# Patient Record
Sex: Male | Born: 1965 | Race: White | Hispanic: No | Smoking: Current every day smoker
Health system: Southern US, Community
[De-identification: ages and names within clinical notes are randomized; demographics above are authoritative.]

## PROBLEM LIST (undated history)

## (undated) DIAGNOSIS — I872 Venous insufficiency (chronic) (peripheral): Secondary | ICD-10-CM

## (undated) DIAGNOSIS — S060X9A Concussion with loss of consciousness of unspecified duration, initial encounter: Secondary | ICD-10-CM

## (undated) DIAGNOSIS — S3992XA Unspecified injury of lower back, initial encounter: Secondary | ICD-10-CM

## (undated) DIAGNOSIS — R0989 Other specified symptoms and signs involving the circulatory and respiratory systems: Secondary | ICD-10-CM

## (undated) DIAGNOSIS — E785 Hyperlipidemia, unspecified: Secondary | ICD-10-CM

## (undated) DIAGNOSIS — I2510 Atherosclerotic heart disease of native coronary artery without angina pectoris: Secondary | ICD-10-CM

## (undated) DIAGNOSIS — F1020 Alcohol dependence, uncomplicated: Secondary | ICD-10-CM

## (undated) DIAGNOSIS — S62101A Fracture of unspecified carpal bone, right wrist, initial encounter for closed fracture: Secondary | ICD-10-CM

## (undated) DIAGNOSIS — L400 Psoriasis vulgaris: Secondary | ICD-10-CM

## (undated) DIAGNOSIS — S42009A Fracture of unspecified part of unspecified clavicle, initial encounter for closed fracture: Secondary | ICD-10-CM

## (undated) DIAGNOSIS — J449 Chronic obstructive pulmonary disease, unspecified: Secondary | ICD-10-CM

## (undated) DIAGNOSIS — S42301A Unspecified fracture of shaft of humerus, right arm, initial encounter for closed fracture: Secondary | ICD-10-CM

## (undated) DIAGNOSIS — I219 Acute myocardial infarction, unspecified: Secondary | ICD-10-CM

## (undated) DIAGNOSIS — S060XAA Concussion with loss of consciousness status unknown, initial encounter: Secondary | ICD-10-CM

## (undated) DIAGNOSIS — S82899A Other fracture of unspecified lower leg, initial encounter for closed fracture: Secondary | ICD-10-CM

## (undated) DIAGNOSIS — E669 Obesity, unspecified: Secondary | ICD-10-CM

## (undated) DIAGNOSIS — R001 Bradycardia, unspecified: Secondary | ICD-10-CM

## (undated) DIAGNOSIS — F17200 Nicotine dependence, unspecified, uncomplicated: Secondary | ICD-10-CM

## (undated) HISTORY — DX: Nicotine dependence, unspecified, uncomplicated: F17.200

## (undated) HISTORY — DX: Bradycardia, unspecified: R00.1

## (undated) HISTORY — DX: Unspecified fracture of shaft of humerus, right arm, initial encounter for closed fracture: S42.301A

## (undated) HISTORY — DX: Concussion with loss of consciousness of unspecified duration, initial encounter: S06.0X9A

## (undated) HISTORY — DX: Other fracture of unspecified lower leg, initial encounter for closed fracture: S82.899A

## (undated) HISTORY — PX: WRIST FRACTURE SURGERY: SHX121

## (undated) HISTORY — DX: Fracture of unspecified carpal bone, right wrist, initial encounter for closed fracture: S62.101A

## (undated) HISTORY — PX: WISDOM TOOTH EXTRACTION: SHX21

## (undated) HISTORY — DX: Acute myocardial infarction, unspecified: I21.9

## (undated) HISTORY — DX: Chronic obstructive pulmonary disease, unspecified: J44.9

## (undated) HISTORY — DX: Obesity, unspecified: E66.9

## (undated) HISTORY — DX: Hyperlipidemia, unspecified: E78.5

## (undated) HISTORY — DX: Atherosclerotic heart disease of native coronary artery without angina pectoris: I25.10

## (undated) HISTORY — DX: Unspecified injury of lower back, initial encounter: S39.92XA

## (undated) HISTORY — DX: Alcohol dependence, uncomplicated: F10.20

## (undated) HISTORY — DX: Fracture of unspecified part of unspecified clavicle, initial encounter for closed fracture: S42.009A

## (undated) HISTORY — DX: Other specified symptoms and signs involving the circulatory and respiratory systems: R09.89

## (undated) HISTORY — DX: Venous insufficiency (chronic) (peripheral): I87.2

## (undated) HISTORY — DX: Psoriasis vulgaris: L40.0

## (undated) HISTORY — PX: CORONARY STENT PLACEMENT: SHX1402

## (undated) HISTORY — DX: Concussion with loss of consciousness status unknown, initial encounter: S06.0XAA

---

## 2014-04-17 DIAGNOSIS — F17200 Nicotine dependence, unspecified, uncomplicated: Secondary | ICD-10-CM | POA: Insufficient documentation

## 2014-04-17 DIAGNOSIS — I2510 Atherosclerotic heart disease of native coronary artery without angina pectoris: Secondary | ICD-10-CM | POA: Insufficient documentation

## 2014-04-19 DIAGNOSIS — L400 Psoriasis vulgaris: Secondary | ICD-10-CM | POA: Insufficient documentation

## 2014-04-19 DIAGNOSIS — E785 Hyperlipidemia, unspecified: Secondary | ICD-10-CM | POA: Insufficient documentation

## 2014-04-19 DIAGNOSIS — R0989 Other specified symptoms and signs involving the circulatory and respiratory systems: Secondary | ICD-10-CM | POA: Insufficient documentation

## 2019-09-19 ENCOUNTER — Other Ambulatory Visit: Payer: Self-pay

## 2019-09-19 ENCOUNTER — Ambulatory Visit (INDEPENDENT_AMBULATORY_CARE_PROVIDER_SITE_OTHER): Payer: Self-pay | Admitting: Nurse Practitioner

## 2019-09-19 ENCOUNTER — Telehealth: Payer: Self-pay

## 2019-09-19 VITALS — BP 122/71 | HR 70 | Temp 98.1°F | Ht 71.0 in | Wt 242.0 lb

## 2019-09-19 DIAGNOSIS — F17200 Nicotine dependence, unspecified, uncomplicated: Secondary | ICD-10-CM

## 2019-09-19 DIAGNOSIS — E782 Mixed hyperlipidemia: Secondary | ICD-10-CM

## 2019-09-19 DIAGNOSIS — Z7689 Persons encountering health services in other specified circumstances: Secondary | ICD-10-CM

## 2019-09-19 DIAGNOSIS — L400 Psoriasis vulgaris: Secondary | ICD-10-CM

## 2019-09-19 DIAGNOSIS — L409 Psoriasis, unspecified: Secondary | ICD-10-CM

## 2019-09-19 DIAGNOSIS — R0989 Other specified symptoms and signs involving the circulatory and respiratory systems: Secondary | ICD-10-CM

## 2019-09-19 DIAGNOSIS — I2510 Atherosclerotic heart disease of native coronary artery without angina pectoris: Secondary | ICD-10-CM

## 2019-09-19 MED ORDER — CETIRIZINE HCL 10 MG PO TABS: 10.0000 mg | ORAL_TABLET | Freq: Every day | ORAL | 11 refills | Status: AC

## 2019-09-19 MED ORDER — CLOBETASOL PROPIONATE 0.05 % EX OINT: 1.0000 "application " | TOPICAL_OINTMENT | Freq: Two times a day (BID) | CUTANEOUS | 2 refills | Status: DC

## 2019-09-19 MED ORDER — ALBUTEROL SULFATE HFA 108 (90 BASE) MCG/ACT IN AERS: 2.0000 | INHALATION_SPRAY | Freq: Four times a day (QID) | RESPIRATORY_TRACT | 2 refills | Status: DC | PRN

## 2019-09-19 NOTE — Assessment & Plan Note (Addendum)
Chronic, ongoing.  Never diagnosed with COPD but likely cause with extensive smoking history.  Rx given for new albuterol inahler.  Will need to perform PFTs in near future.  Follow up 1 month.

## 2019-09-19 NOTE — Assessment & Plan Note (Addendum)
Chronic, ongoing.  Previously on statin medication but not currently taking due to cost.  Important to monitor cholesterol levels closely especially with CAD history, will discuss at follow up in 1 month.

## 2019-09-19 NOTE — Assessment & Plan Note (Signed)
Chronic, ongoing.  Patient has cut back over the years from 3 ppd to 1.5 ppd.  Currently trying to continue cutting back with end goal of quitting smoking completely.  Will continue to support patient with pharmacologic therapy and/or counseling during cessation process.  Follow up 1 month.

## 2019-09-19 NOTE — Progress Notes (Signed)
BP 122/71 (BP Location: Right Arm, Patient Position: Sitting, Cuff Size: Normal)   Pulse 70   Temp 98.1 F (36.7 C) (Oral)   Ht 5\' 11"  (1.803 m)   Wt 242 lb (109.8 kg)   SpO2 100%   BMI 33.75 kg/m    Subjective:    Patient ID: Jacob Owen, male    DOB: 07-16-1965, 54 y.o.   MRN: [id number]  HPI: Jacob Owen is a 54 y.o. male presents for new patient visit to establish care.  Introduced to 59 role and practice setting.  All questions answered.  Discussed provider/patient relationship and expectations.  Chief Complaint  Patient presents with  . Establish Care  . Psoriasis    Severe psoriasis all over body  . Bed Bugs  . COPD   Patient has multiple chronic issues and has been lost to follow up for years due to being out of insurance and not being able to afford his medications.  He is here today for his psoriasis, which has become severe, and COPD primarily.  He states he will only be located in this area [place] for the next few months, while he is settling his mother's estate.  He plans to move away from this area shortly after and plans to obtain insurance and establish with a new primary care provider at that time.  SKIN LESION Patient was diagnosed with psoriasis when he was 20 and at [facility] in [place].  He reports it started on his nose and has spread to all over his body throughout his life.  Patient also reports bed bugs at the place that he is staying as well as black mold.  He is starting to have peeling on his hands and this is new.  Patient is requesting a referral for a dermatologist.   Duration: years Location: head to toe Painful: no Itching: yes; feels like a really bad sunburn  Onset: sudden Context: bigger Associated signs and symptoms: no fever, chest pain, or shortness of breath. History of skin cancer: no History of precancerous skin lesions: no Family history of skin cancer: no  COPD He was diagnosed with COPD a few  years ago.  He is taking albuterol inhaler a couple of times per day or more.  When patient's mother passed away in 2022/11/25, he took her inhaler and has been using that since he has not had one. COPD status: stable Satisfied with current treatment?: no Oxygen use: no Dyspnea frequency: daily Cough frequency: daily Rescue inhaler frequency: a couple of timers per day   Limitation of activity: yes Productive cough:  yes Last Spirometry: per patient, never performed Pneumovax: unknown Influenza: deferred to flu season  Allergies  Allergen Reactions  . Chocolate     Other reaction(s): ANAPHYLAXIS  . Codeine Nausea And Vomiting  . Penicillins Itching   Outpatient Encounter Medications as of 09/19/2019  Medication Sig  . albuterol (VENTOLIN HFA) 108 (90 Base) MCG/ACT inhaler Inhale 2 puffs into the lungs every 6 (six) hours as needed.  . Coal Tar Extract (PSORIASIN EX) Apply 1 application topically daily as needed.  . [DISCONTINUED] albuterol (VENTOLIN HFA) 108 (90 Base) MCG/ACT inhaler Inhale 2 puffs into the lungs every 6 (six) hours as needed.  . cetirizine (ZYRTEC) 10 MG tablet Take 1 tablet (10 mg total) by mouth daily.  . clobetasol ointment (TEMOVATE) 0.05 % Apply 1 application topically 2 (two) times daily.   No facility-administered encounter medications on file as of 09/19/2019.  Active Ambulatory Problems    Diagnosis Date Noted  . Coronary artery disease involving native coronary artery of native heart without angina pectoris 04/17/2014  . Hyperlipidemia 04/19/2014  . Plaque psoriasis 04/19/2014  . Suspected respiratory disease 04/19/2014  . Tobacco use disorder 04/17/2014   Resolved Ambulatory Problems    Diagnosis Date Noted  . No Resolved Ambulatory Problems   Past Medical History:  Diagnosis Date  . Alcoholism ([place])   . Ankle fracture   . Arm fracture, right   . Bradycardia   . Chronic venous stasis dermatitis of both lower extremities   . Collar bone fracture    . Concussion   . COPD (chronic obstructive pulmonary disease) ([place])   . Myocardial infarction ([place])   . Obesity   . Right wrist fracture   . Tailbone injury    Past Surgical History:  Procedure Laterality Date  . CORONARY STENT PLACEMENT    . WISDOM TOOTH EXTRACTION    . WRIST FRACTURE SURGERY     2 screws put in wrist   Family History  Problem Relation Age of Onset  . Diabetes Mother   . Emphysema Mother   . Alzheimer's disease Mother   . Alcoholism Mother   . Diabetes Brother   . Hepatitis C Father   . Biliary Cirrhosis Father   . Mental illness Maternal Grandmother    Social History   Tobacco Use  . Smoking status: Current Every Day Smoker    Packs/day: 1.00    Years: 44.00    Pack years: 44.00    Types: Cigarettes  . Smokeless tobacco: Never Used  Substance Use Topics  . Alcohol use: Not Currently    Comment: Hasn't drank in 36 years.  . Drug use: Yes    Types: Marijuana    Comment: Very little-Rarely   Review of Systems  Constitutional: Negative.  Negative for chills, fatigue and fever.  HENT: Negative.   Respiratory: Positive for shortness of breath. Negative for chest tightness and wheezing.   Cardiovascular: Negative.  Negative for chest pain and palpitations.  Gastrointestinal: Negative.   Musculoskeletal: Negative.   Skin: Positive for color change and rash. Negative for pallor and wound.  Neurological: Negative.  Negative for dizziness, light-headedness and headaches.  Psychiatric/Behavioral: Negative.  Negative for confusion. The patient is not nervous/anxious.    Per HPI unless specifically indicated above     Objective:    BP 122/71 (BP Location: Right Arm, Patient Position: Sitting, Cuff Size: Normal)   Pulse 70   Temp 98.1 F (36.7 C) (Oral)   Ht 5\' 11"  (1.803 m)   Wt 242 lb (109.8 kg)   SpO2 100%   BMI 33.75 kg/m   Wt Readings from Last 3 Encounters:  09/19/19 242 lb (109.8 kg)    Physical Exam Vitals and nursing note reviewed.   Constitutional:      General: He is not in acute distress.    Appearance: Normal appearance. He is not toxic-appearing.  HENT:     Head: Normocephalic and atraumatic.  Eyes:     General: No scleral icterus.    Extraocular Movements: Extraocular movements intact.  Cardiovascular:     Rate and Rhythm: Normal rate and regular rhythm.  Pulmonary:     Effort: Pulmonary effort is normal. No respiratory distress.     Breath sounds: Decreased air movement present. No wheezing, rhonchi or rales.  Abdominal:     General: Abdomen is flat. There is no distension.  Tenderness: There is no abdominal tenderness.  Musculoskeletal:        General: No swelling. Normal range of motion.     Right lower leg: No edema.     Left lower leg: No edema.  Skin:    Findings: Lesion (multiple circular plaques over body from head to toe resembling psoriasis) present. No abscess or wound.  Neurological:     General: No focal deficit present.     Mental Status: He is alert and oriented to person, place, and time.     Motor: No weakness.     Gait: Gait normal.  Psychiatric:        Mood and Affect: Mood normal.        Behavior: Behavior normal.        Thought Content: Thought content normal.        Judgment: Judgment normal.    No results found for this or any previous visit.    Assessment & Plan:   Problem List Items Addressed This Visit      Cardiovascular and Mediastinum   Coronary artery disease involving native coronary artery of native heart without angina pectoris    Chronic, ongoing.  Will need labs drawn and to establish with Cardiologist in future.  No chest pain, shortness of breath, or symptoms currently.  BP and HR stable in office today.  Follow up in 1 month.        Musculoskeletal and Integument   Plaque psoriasis    Chronic, ongoing.  Will start zyrtec daily to help with itching and clobetasol ointment bid and place referral to Dermatology.  CCM consulted for medication assistance  program.  Follow up in 1 month.        Other   Hyperlipidemia    Chronic, ongoing.  Previously on statin medication but not currently taking due to cost.  Important to monitor cholesterol levels closely especially with CAD history, will discuss at follow up in 1 month.      Suspected respiratory disease    Chronic, ongoing.  Never diagnosed with COPD but likely cause with extensive smoking history.  Rx given for new albuterol inahler.  Will need to perform PFTs in near future.  Follow up 1 month.      Relevant Medications   albuterol (VENTOLIN HFA) 108 (90 Base) MCG/ACT inhaler   Other Relevant Orders   Referral to Chronic Care Management Services   Tobacco use disorder    Chronic, ongoing.  Patient has cut back over the years from 3 ppd to 1.5 ppd.  Currently trying to continue cutting back with end goal of quitting smoking completely.  Will continue to support patient with pharmacologic therapy and/or counseling during cessation process.  Follow up 1 month.       Other Visit Diagnoses    Psoriasis    -  Primary   Relevant Medications   cetirizine (ZYRTEC) 10 MG tablet   clobetasol ointment (TEMOVATE) 0.05 %   Other Relevant Orders   Ambulatory referral to Dermatology   Referral to Chronic Care Management Services   Encounter to establish care           Follow up plan: Return in about 1 month (around 10/20/2019) for follow up.

## 2019-09-19 NOTE — Assessment & Plan Note (Addendum)
Chronic, ongoing.  Will need labs drawn and to establish with Cardiologist in future.  No chest pain, shortness of breath, or symptoms currently.  BP and HR stable in office today.  Follow up in 1 month.

## 2019-09-19 NOTE — Telephone Encounter (Signed)
Copied from CRM [id number]. Topic: General - Inquiry >> Sep 19, 2019  3:08 PM Crist Infante wrote: Reason for CRM: pt states he was looking at his AVS, and his weight was recorded incorrectly..  Pt states he actually weighed in at 242 lb.  His AVS says 171 lbs. Pt does not want the dr to think he has gained a bunch of weight when he comes back next time.   Chart updated with correct information.

## 2019-09-19 NOTE — Assessment & Plan Note (Addendum)
Chronic, ongoing.  Will start zyrtec daily to help with itching and clobetasol ointment bid and place referral to Dermatology.  CCM consulted for medication assistance program.  Follow up in 1 month.

## 2019-09-22 ENCOUNTER — Other Ambulatory Visit: Payer: Self-pay | Admitting: Nurse Practitioner

## 2019-09-22 ENCOUNTER — Ambulatory Visit: Payer: Self-pay

## 2019-09-22 ENCOUNTER — Telehealth: Payer: Self-pay | Admitting: Nurse Practitioner

## 2019-09-22 ENCOUNTER — Encounter: Payer: Self-pay | Admitting: Nurse Practitioner

## 2019-09-22 DIAGNOSIS — L409 Psoriasis, unspecified: Secondary | ICD-10-CM

## 2019-09-22 MED ORDER — PREDNISONE 10 MG PO TABS: ORAL_TABLET | ORAL | 0 refills | Status: DC

## 2019-09-22 NOTE — Telephone Encounter (Signed)
I also received the patient's mychart message this morning and responded to same concern there - have sent in a steroid taper and offered virtual video visit if he would like me to take a look at his hands.

## 2019-09-22 NOTE — Chronic Care Management (AMB) (Signed)
  Care Management   Note  09/22/2019 Name: Jacob Owen MRN: [id number] DOB: 04-03-1966  Jacob Owen is a 54 y.o. year old male who is a primary care patient of Mardene Celeste I, NP. I reached out to Minette Brine by phone today in response to a referral sent by [name] health plan.    [name] was given information about care management services today including:  1. Care management services include personalized support from designated clinical staff supervised by his physician, including individualized plan of care and coordination with other care providers 2. 24/7 contact phone numbers for assistance for urgent and routine care needs. 3. The patient may stop care management services at any time by phone call to the office staff.  Patient agreed to services and verbal consent obtained.   Follow up plan: Telephone appointment with care management team member scheduled for:10/08/2019  Elisha Ponder, LPN Health Advisor, Embedded Care Coordination [facility] Health Care Management ??[email or web address] ??[phone]

## 2019-09-22 NOTE — Telephone Encounter (Signed)
Pt. Called. Thought he had a missed call from triage. He did not. Verbalizes understanding.

## 2019-09-22 NOTE — Telephone Encounter (Signed)
Pt. Reports he was seen Friday and has psoriasis and bed bug bites. Both hands are swollen. Mild redness. Taking Zyrtec. Concerned he could be allergic to the bed bugs. Wants to know if there is something else he can take.Please advise pt.  Answer Assessment - Initial Assessment Questions 1. ONSET: "When did the swelling start?" (e.g., minutes, hours, days)     Started Friday 2. LOCATION: "What part of the hand is swollen?"  "Are both hands swollen or just one hand?"     Both hands 3. SEVERITY: "How bad is the swelling?" (e.g., localized; mild, moderate, severe)   - BALL OR LUMP: small ball or lump   - LOCALIZED: puffy or swollen area or patch of skin   - JOINT SWELLING: swelling of a joint   - MILD: puffiness or mild swelling of fingers or hand   - MODERATE: fingers and hand are swollen   - SEVERE: swelling of entire hand and up into forearm     Moderate 4. REDNESS: "Does the swelling look red or infected?"     Mild 5. PAIN: "Is the swelling painful to touch?" If so, ask: "How painful is it?"   (Scale 1-10; mild, moderate or severe)     Shoulders, upper arms 6. FEVER: "Do you have a fever?" If so, ask: "What is it, how was it measured, and when did it start?"      Chills at night 7. CAUSE: "What do you think is causing the hand swelling?" (e.g., heat, insect bite, pregnancy, recent injury)     Bed bugs and psoriasis  8. MEDICAL HISTORY: "Do you have a history of heart failure, kidney disease, liver failure, or cancer?"     No 9. RECURRENT SYMPTOM: "Have you had hand swelling before?" If so, ask: "When was the last time?" "What happened that time?"     Yes 10. OTHER SYMPTOMS: "Do you have any other symptoms?" (e.g., blurred vision, difficulty breathing, headache)       No 11. PREGNANCY: "Is there any chance you are pregnant?" "When was your last menstrual period?"       n/a  Protocols used: HAND [facility]

## 2019-09-24 ENCOUNTER — Encounter: Payer: Self-pay | Admitting: Nurse Practitioner

## 2019-09-25 NOTE — Telephone Encounter (Signed)
Would he benefit from a referral to CCM team, to see if they can help him with getting his medications?

## 2019-09-26 ENCOUNTER — Ambulatory Visit (INDEPENDENT_AMBULATORY_CARE_PROVIDER_SITE_OTHER): Payer: Self-pay | Admitting: Family Medicine

## 2019-09-26 ENCOUNTER — Encounter: Payer: Self-pay | Admitting: Family Medicine

## 2019-09-26 ENCOUNTER — Other Ambulatory Visit: Payer: Self-pay

## 2019-09-26 VITALS — BP 153/73 | HR 74 | Temp 98.0°F | Wt 243.0 lb

## 2019-09-26 DIAGNOSIS — L409 Psoriasis, unspecified: Secondary | ICD-10-CM

## 2019-09-26 DIAGNOSIS — L309 Dermatitis, unspecified: Secondary | ICD-10-CM

## 2019-09-26 MED ORDER — CLOBETASOL PROPIONATE 0.05 % EX OINT: 1.0000 "application " | TOPICAL_OINTMENT | Freq: Two times a day (BID) | CUTANEOUS | 2 refills | Status: AC

## 2019-09-26 MED ORDER — TRIAMCINOLONE ACETONIDE 0.1 % EX CREA: 1.0000 "application " | TOPICAL_CREAM | Freq: Two times a day (BID) | CUTANEOUS | 1 refills | Status: AC

## 2019-09-26 NOTE — Progress Notes (Signed)
BP (!) 153/73   Pulse 74   Temp 98 F (36.7 C) (Oral)   Wt 243 lb (110.2 kg)   SpO2 99%   BMI 33.89 kg/m    Subjective:    Patient ID: Jacob Owen, male    DOB: [birth year], 54 y.o.   MRN: [id number]  HPI: Jacob Owen is a 54 y.o. male  Chief Complaint  Patient presents with  . Skin Problem    bilateral forearms and hands   Here today to discuss worsening psoriasis flare and peeling, bleeding painful hands the past 4-6 days. Has dealt with head to toe plaque psoriasis for years, but never to the severity he's currently experiencing. Does not have insurance so has not sought care for this in the past few years aside from the past 2 weeks. Was given prednisone taper and topical steroids, did not start the clobetasol cream due to Pharmacist cautioning him about worsening his condition. Has not been moisturizing his skin regularly, mostly doing sponge bathing with antibacterial soaps due to his current living conditions. Has never been on psoriasis treatment in the past. Notes his hands are incredibly painful with movement, cracking and bleeding with the slightest motion.   Relevant past medical, surgical, family and social history reviewed and updated as indicated. Interim medical history since our last visit reviewed. Allergies and medications reviewed and updated.  Review of Systems  Per HPI unless specifically indicated above     Objective:    BP (!) 153/73   Pulse 74   Temp 98 F (36.7 C) (Oral)   Wt 243 lb (110.2 kg)   SpO2 99%   BMI 33.89 kg/m   Wt Readings from Last 3 Encounters:  09/26/19 243 lb (110.2 kg)  09/19/19 242 lb (109.8 kg)    Physical Exam Vitals and nursing note reviewed.  Constitutional:      Appearance: Normal appearance.  HENT:     Head: Atraumatic.  Eyes:     Extraocular Movements: Extraocular movements intact.     Conjunctiva/sclera: Conjunctivae normal.  Cardiovascular:     Rate and Rhythm: Normal rate and regular rhythm.   Pulmonary:     Effort: Pulmonary effort is normal.     Breath sounds: Normal breath sounds.  Musculoskeletal:        General: Normal range of motion.     Cervical back: Normal range of motion and neck supple.  Skin:    General: Skin is warm.     Comments: Scalp diffusely coated with thick plaques and flaking, as is most of his body sparing his face. B/l forearms erythematous, tender with flaking and b/l hands diffusely cracked, dry and peeling with dried blood areas present throughout  Neurological:     General: No focal deficit present.     Mental Status: He is oriented to person, place, and time.  Psychiatric:        Mood and Affect: Mood normal.        Thought Content: Thought content normal.        Judgment: Judgment normal.     No results found for this or any previous visit.    Assessment & Plan:   Problem List Items Addressed This Visit    None    Visit Diagnoses    Psoriasis    -  Primary   Relevant Medications   clobetasol ointment (TEMOVATE) 0.05 %   Other Relevant Orders   Ambulatory referral to Dermatology   Hand dermatitis  Relevant Orders   Ambulatory referral to Dermatology      Will place urgent referral to Dermatology for immediate eval and consideration for psoriasis medications, previous referral was placed but soonest appt was 2-3 months out. In meantime, complete prednisone, discussed a short course of clobetasol (about 1 week) followed by consistent prn use of triamcinolone. Also discussed importance of good moisturizer regimen to help provide a barrier for the skin, recommended plain cera ve in a tub twice daily across body. Avoid hot water, scented soaps, lotions, detergents, excessive handwashing. Reviewed signs of possible secondary bacterial or fungal infection and discussed calling if these are happening. Also gave resources about med mgmt clinic to help with medication coverage, particularly if being placed on new Derm medications once  consultation able to happen  Follow up plan: Return if symptoms worsen or fail to improve.

## 2019-09-26 NOTE — Patient Instructions (Addendum)
Cera ve cream - use twice daily all over  Use the clobetasol twice daily for about a week (avoid face and private areas with that one) and then take a break from that one and switch to the triamcinolone cream twice daily for several weeks. You can use the clobetasol for spot treatment going forward

## 2019-10-01 ENCOUNTER — Encounter: Payer: Self-pay | Admitting: Family Medicine

## 2019-10-06 ENCOUNTER — Ambulatory Visit (INDEPENDENT_AMBULATORY_CARE_PROVIDER_SITE_OTHER): Payer: Self-pay | Admitting: Dermatology

## 2019-10-06 ENCOUNTER — Other Ambulatory Visit: Payer: Self-pay

## 2019-10-06 DIAGNOSIS — L409 Psoriasis, unspecified: Secondary | ICD-10-CM

## 2019-10-06 MED ORDER — SECUKINUMAB (300 MG DOSE) 150 MG/ML ~~LOC~~ SOAJ: 300.0000 mg | Freq: Once | SUBCUTANEOUS | Status: DC

## 2019-10-06 MED ORDER — TRIAMCINOLONE ACETONIDE 0.05 % EX OINT: TOPICAL_OINTMENT | CUTANEOUS | 2 refills | Status: DC

## 2019-10-06 MED ORDER — SECUKINUMAB (300 MG DOSE) 150 MG/ML ~~LOC~~ SOAJ
300.0000 mg | Freq: Once | SUBCUTANEOUS | Status: AC
  Administered 2019-10-06: 300 mg via SUBCUTANEOUS

## 2019-10-06 NOTE — Progress Notes (Signed)
   New Patient Visit  Subjective  Jacob Owen is a 54 y.o. male who presents for the following: Psoriasis. Patient has had psoriasis on the arms, legs, and feet since the age of 33. He has treated it over the years with many OTC creams and light treatment. Patient had an outbreak of bedbugs 3 weeks ago. Psoriasis flared all over body, face, scalp and has not gotten any better. He is using TMC 0.1% Cream, Psoriasin Cream, and oral Prednisone. The psoriasis on his feet has made it difficult to walk and on the hands has made it difficult to grab things. Rash is very itchy. Patient is miserable.  He doesn't have joint pain.  The following portions of the chart were reviewed this encounter and updated as appropriate:      Review of Systems:  No other skin or systemic complaints except as noted in HPI or Assessment and Plan.  Objective  Well appearing patient in no apparent distress; mood and affect are within normal limits.  A focused examination was performed including arms, legs, face, scalp, trunk. Relevant physical exam findings are noted in the Assessment and Plan.  Objective  arms, legs, face, scalp, trunk: Diffuse erythema with exfoliative scale.  98% BSA   Assessment & Plan  Psoriasis arms, legs, face, scalp, trunk  Severe, with erythrodermic flare  Start TMC 0.05% Ointment (Trianex) Apply BID to AAs psoriasis Avoid face, groin, axilla. Dsp 430g 2Rf. Info given on wet wraps. Apply 1-2 times a day.  Topical steroids (such as triamcinolone, fluocinolone, fluocinonide, mometasone, clobetasol, halobetasol, betamethasone, hydrocortisone) can cause thinning and lightening of the skin if they are used for too long in the same area. Your physician has selected the right strength medicine for your problem and area affected on the body. Please use your medication only as directed by your physician to prevent side effects.   Recommend medicated shampoo. Samples of Head & Shoulders and  Tar shampoo given.  Start Cosentyx injections pending labs and approval.  Pt will need pt assistance since he is self pay.  Samples of Cosentyx 150mg /mL injections x 2 given today in left anterior thigh and left posterior upper arm.  Patient will be trained next week at nurse visit how to self-inject Cosentyx.   TRIAMCINOLONE ACETONIDE, TOP, (TRIANEX) 0.05 % OINT - arms, legs, face, scalp, trunk  Secukinumab (300 MG Dose) SOAJ 300 mg - arms, legs, face, scalp, trunk  Other Related Procedures Comprehensive metabolic panel CBC with Differential/Platelet Hepatitis B surface antibody,qualitative Hepatitis B surface antigen Hepatitis C antibody HIV Antibody (routine testing w rflx) QuantiFERON-TB Gold Plus  Other Related Medications cetirizine (ZYRTEC) 10 MG tablet predniSONE (DELTASONE) 10 MG tablet clobetasol ointment (TEMOVATE) 0.05 %  Return in about 1 week (around 10/13/2019), or nurse visit - Cosentyx injection. Sample in fridge. Also 2 mo f/u with Dr 10/15/2019 for psoriasis.Kathie Rhodes, CMA, am acting as scribe for Wendee Beavers, MD .  Documentation: I have reviewed the above documentation for accuracy and completeness, and I agree with the above.  Willeen Niece MD

## 2019-10-06 NOTE — Patient Instructions (Signed)
Psoriasis  What causes psoriasis? A patient's immune system plays a role in the development of psoriasis through the over-activity of a type of white blood cell called a T cell.  Once these cells are activated, a reaction is triggered that causes the skin to grow faster than normal.  New skin cells form in days instead of weeks, and these cells do not shed.  These cells pile up on the skin, and this results in what you see as psoriasis.  It is not contagious.  There is a genetic component to psoriasis, although not everyone inherits the gene.  What triggers psoriasis? Common triggers are stress, strep throat infection (more commonly in kids), and cold weather.  During stressful events, psoriasis tends to flare.  Certain medications such as lithium, some blood pressure medications, and some drugs used to treat malaria can trigger psoriasis.  A skin injury can also trigger psoriasis to develop at the site of injury.  Types of Psoriasis 1. Plaque psoriasis:  80% of patients with psoriasis have plaque psoriasis.  Plaques usually form on elbows, knees, and lower back and appear raised and reddish with a silvery white scale. 2. Nail psoriasis:  Fingernails and toenails may be affected.  Initially small pits may form, then with time, the nails thicken, lift up, and crumble.   3. Scalp psoriasis:  Similar in appearance to plaque psoriasis on the body.  Tends to be itchy.  Clinically can resemble dandruff because of the scales that can fall on a patient's shirt.  This may be difficult to control. 4. Pustular psoriasis:  Usually involves the palms and soles appearing as white, pus-filled bumps.  Rarely, it may develop all over the body, which can make patients seriously ill. 5. Guttate psoriasis:  Usually occurs in children and young adults.  The lesions are smaller than plaque psoriasis.  May be triggered by a strep throat infection.  Many times it clears on its own, and the patient may or may not develop  psoriasis again.   6. Inverse psoriasis:  Involves the folds of the skin and may be painful.  This appears as red, shiny patches.  It may involve the armpits, under the breasts, the genital area, or the crease of the buttock.  7. Psoriatic arthritis:  Up to one third of patients with psoriasis will develop arthritis.  It commonly affects the hands, feet, and spine, and may begin as stiffness.  If untreated it may lead to permanent joint damage.  Treatment There is no cure for psoriasis, but it can be controlled.  Some patients undergo more than one type of treatment.  1. Topical medications (applied externally to the skin) a. Corticosteroids:  typically first-line treatment for psoriasis.  They control the inflammation of psoriasis.  They are available as creams, ointments, sprays, foams, or lotions.  It is important to follow your dermatologist's instructions as to how to apply the medicine.  For example, excessive use of strong steroid creams (especially to body creases and the face) can cause thinning and lightening of the skin.  This can lead to the formation of stretch marks in the folds.   b. Calcipotriene and calcipotriol (Vitamin D derivatives):  These are usually used in conjunction with a steroid cream.  Sometimes they may be used as maintenance once psoriasis is under control. c. Retinoids:  sometimes used in conjunction with a topical steroid cream.  Women should not use retinoids if they are pregnant. d. Coal tar:  an older treatment that   is still effective, especially in combination with steroids.  It can be messy and may have an odor in some preparations. 2. Light treatments:  may provide a safe and effective treatment for psoriasis.  The main risks of light therapy are sunburn and possible increase in skin cancers.   a. Laser therapy:  can treat a certain stubborn area of psoriasis such as scalp, feet, and hands.  It is not used for large areas. b. Narrowband UVB:  A patient stands in  front of panels of lights for a set amount of time.  A typical course might be 24 treatments over 2 months.  c. PUVA:  This treatment combines exposure to UVA light with light-sensitizing medication called psoralen.  It may come as a pill or as a lotion.  The main side effects are nausea (from the psoralen pill) and significantly increased risk of skin cancer. 3. Oral medications:  used for moderate to severe psoriasis.  These medications are very effective, but they have a number of potentially serious side effects. a. Methotrexate b. Soriatane (acitretin) c. Cyclosporine 4. Biologic medications:  used for moderate to severe psoriasis.  These are newer medications that suppress the immune cells responsible for psoriasis.  They generally produce good results, but they all increase the risk of infection.  These are given as injections or IV infusions. a. Enbrel (etanercept) b. Humira (adalimumab) c. Remicade (infliximab) d. Stelara (ustekinumab)  Recent studies have found that patients with psoriasis are more prone to developing metabolic syndrome:  high blood pressure, coronary artery disease, high cholesterol, and diabetes.  It is very important for patients with psoriasis to live healthy lifestyles.  Diet and exercise are important.  Support groups:   www.psoriasis.org , http://www.[facility].com/psoriasisnet/index.html    

## 2019-10-08 ENCOUNTER — Ambulatory Visit: Payer: Self-pay | Admitting: Pharmacist

## 2019-10-08 ENCOUNTER — Telehealth: Payer: Self-pay | Admitting: Pharmacy Technician

## 2019-10-08 DIAGNOSIS — L400 Psoriasis vulgaris: Secondary | ICD-10-CM

## 2019-10-08 DIAGNOSIS — I2510 Atherosclerotic heart disease of native coronary artery without angina pectoris: Secondary | ICD-10-CM

## 2019-10-08 LAB — CBC WITH DIFFERENTIAL/PLATELET
Basophils Absolute: 0 10*3/uL (ref 0.0–0.2)
Basos: 0 %
EOS (ABSOLUTE): 0.2 10*3/uL (ref 0.0–0.4)
Eos: 1 %
Hematocrit: 42.7 % (ref 37.5–51.0)
Hemoglobin: 14.2 g/dL (ref 13.0–17.7)
Immature Grans (Abs): 0 10*3/uL (ref 0.0–0.1)
Immature Granulocytes: 0 %
Lymphocytes Absolute: 1.4 10*3/uL (ref 0.7–3.1)
Lymphs: 11 %
MCH: 31.6 pg (ref 26.6–33.0)
MCHC: 33.3 g/dL (ref 31.5–35.7)
MCV: 95 fL (ref 79–97)
Monocytes Absolute: 0.9 10*3/uL (ref 0.1–0.9)
Monocytes: 7 %
Neutrophils Absolute: 10.1 10*3/uL — ABNORMAL HIGH (ref 1.4–7.0)
Neutrophils: 81 %
Platelets: 311 10*3/uL (ref 150–450)
RBC: 4.5 x10E6/uL (ref 4.14–5.80)
RDW: 12.9 % (ref 11.6–15.4)
WBC: 12.6 10*3/uL — ABNORMAL HIGH (ref 3.4–10.8)

## 2019-10-08 LAB — COMPREHENSIVE METABOLIC PANEL
ALT: 26 IU/L (ref 0–44)
AST: 20 IU/L (ref 0–40)
Albumin/Globulin Ratio: 1.2 (ref 1.2–2.2)
Albumin: 3.6 g/dL — ABNORMAL LOW (ref 3.8–4.9)
Alkaline Phosphatase: 89 IU/L (ref 48–121)
BUN/Creatinine Ratio: 6 — ABNORMAL LOW (ref 9–20)
BUN: 6 mg/dL (ref 6–24)
Bilirubin Total: 0.9 mg/dL (ref 0.0–1.2)
CO2: 20 mmol/L (ref 20–29)
Calcium: 8.5 mg/dL — ABNORMAL LOW (ref 8.7–10.2)
Chloride: 101 mmol/L (ref 96–106)
Creatinine, Ser: 1.03 mg/dL (ref 0.76–1.27)
GFR calc Af Amer: 93 mL/min/{1.73_m2} (ref >59)
GFR calc non Af Amer: 81 mL/min/{1.73_m2} (ref >59)
Globulin, Total: 2.9 g/dL (ref 1.5–4.5)
Glucose: 249 mg/dL — ABNORMAL HIGH (ref 65–99)
Potassium: 4.4 mmol/L (ref 3.5–5.2)
Sodium: 137 mmol/L (ref 134–144)
Total Protein: 6.5 g/dL (ref 6.0–8.5)

## 2019-10-08 LAB — HIV ANTIBODY (ROUTINE TESTING W REFLEX): HIV Screen 4th Generation wRfx: NONREACTIVE

## 2019-10-08 LAB — QUANTIFERON-TB GOLD PLUS
QuantiFERON Mitogen Value: >10 IU/mL
QuantiFERON Nil Value: 0 IU/mL
QuantiFERON TB1 Ag Value: 0 IU/mL
QuantiFERON TB2 Ag Value: 0.01 IU/mL
QuantiFERON-TB Gold Plus: NEGATIVE

## 2019-10-08 LAB — HEPATITIS B SURFACE ANTIGEN: Hepatitis B Surface Ag: NEGATIVE

## 2019-10-08 LAB — HEPATITIS C ANTIBODY: Hep C Virus Ab: <0.1 s/co ratio (ref 0.0–0.9)

## 2019-10-08 LAB — HEPATITIS B SURFACE ANTIBODY,QUALITATIVE: Hep B Surface Ab, Qual: NONREACTIVE

## 2019-10-08 NOTE — Telephone Encounter (Signed)
Provided patient with new patient packet to obtain ongoing [facility] services.  [facility] must receive requested financial documentation within 30 days in order to determine eligibility and provide additional medication assistance.  Uri Covey J. Aishani Kalis Care Manager [facility] 

## 2019-10-08 NOTE — Chronic Care Management (AMB) (Signed)
Chronic Care Management   Note  10/08/2019 Name: Nayef College MRN: [id number] DOB: Aug 13, 1965   Subjective:  Jacob Owen is a 54 y.o. year old male who is a primary care patient of Carnella Guadalajara I, NP. The CCM team was consulted for assistance with chronic disease management and care coordination needs.    Contacted patient for medication management review.   Review of patient status, including review of consultants reports, laboratory and other test data, was performed as part of comprehensive evaluation and provision of chronic care management services.   SDOH (Social Determinants of Health) assessments and interventions performed:  yes  Objective:  Lab Results  Component Value Date   CREATININE 1.03 10/06/2019    No results found for: HGBA1C  No results found for: CHOL, TRIG, HDL, CHOLHDL, VLDL, LDLCALC, LDLDIRECT  Clinical ASCVD: Yes - reported hx MI? The ASCVD Risk score Mikey Bussing DC Jr., et al., 2013) failed to calculate for the following reasons:   Cannot find a previous HDL lab   Cannot find a previous total cholesterol lab    BP Readings from Last 3 Encounters:  09/26/19 (!) 153/73  09/19/19 122/71    Allergies  Allergen Reactions   Chocolate     Other reaction(s): ANAPHYLAXIS   Codeine Nausea And Vomiting   Other Rash    Bed bugs.    Penicillins Itching    Medications Reviewed Today    Reviewed by De Hollingshead, [facility] (Pharmacist) on 10/08/19 at 1455  Med List Status: <None>  Medication Order Taking? Sig Documenting Provider Last Dose Status Informant  albuterol (VENTOLIN HFA) 108 (90 Base) MCG/ACT inhaler [id number]  Inhale 2 puffs into the lungs every 6 (six) hours as needed. Carnella Guadalajara I, NP  Active   cetirizine (ZYRTEC) 10 MG tablet [id number]  Take 1 tablet (10 mg total) by mouth daily. Carnella Guadalajara I, NP  Active   clobetasol ointment (TEMOVATE) 0.05 % [id number] No Apply 1 application topically 2 (two) times daily.  Patient not  taking: Reported on 10/06/2019   Volney American, PA-C Not Taking Active   [address] Extract (PSORIASIN [place]) [id number] Yes Apply 1 application topically daily as needed. [provider] Taking Active   predniSONE (DELTASONE) 10 MG tablet [id number] Yes Take 6 tablets by mouth daily for 2 days, then reduce by 1 tablet every 2 days until gone Carnella Guadalajara I, NP Taking Active   TRIAMCINOLONE ACETONIDE, TOP, (TRIANEX) 0.05 % OINT [id number] No Apply twice a day to affected areas psoriasis. Avoid face, groin, underarms.  Patient not taking: Reported on 10/08/2019   Brendolyn Patty, MD Not Taking Active   triamcinolone cream (KENALOG) 0.1 % [id number] Yes Apply 1 application topically 2 (two) times daily. Volney American, PA-C Taking Active            Assessment:   Goals Addressed            This Visit's Progress     Patient Stated    PharmD "I don't have insurance" (pt-stated)       CARE PLAN ENTRY (see longitudinal plan of care for additional care plan information)  Current Barriers:   Polypharmacy; complex patient with multiple comorbidities including psoriasis, hx CAD (noted hx MI), COPD  Newly established to practice, only temporarily in the area to settle his mother's estate. No insurance. Will be moving away in a little bit and will plan to establish w/ a PCP in that area.   Most recent eGFR: 93 mL/min  o Psoriasis: urgent referral to dermatology. Given first dose of Cosentyx in office, script for compounded triamcinolone ointment. He will be calling the [facility] today to see about mailing it to him. Currently using topical triamcinolone, was unable to pick up clobetasol d/t cost.  o COPD: prescribed albuterol HFA inhaler. Needs spirometry o CAD: reported hx MI.   Pharmacist Clinical Goal(s):   Over the next 90 days, patient will work with PharmD and provider towards optimized medication management  Interventions:  Comprehensive medication review  performed; medication list updated in electronic medical record  Inter-disciplinary care team collaboration (see longitudinal plan of care)  Discussed [facility] and provided contact information. Patient notes he got a message from them today, he will call them back this afternoon to discuss eligibility information. Once connected, will collaborate w/ PCP to send prescriptions to [facility].   Will outreach dermatologist to determine long term plan for Cosentyx (office samples vs collaboration w/ [facility] for patient assistance application).   Moving forward, will address noted hx MI and discuss secondary prevention needs.  Patient Self Care Activities:   Patient will take medications as prescribed  Initial goal documentation l       Plan: - Will collaborate w/ interdisciplinary team as above  Catie Darnelle Maffucci, PharmD, [place] [phone]

## 2019-10-08 NOTE — Patient Instructions (Signed)
Visit Information  Goals Addressed            This Visit's Progress     Patient Stated   . PharmD "I don't have insurance" (pt-stated)       CARE PLAN ENTRY (see longitudinal plan of care for additional care plan information)  Current Barriers:  . Polypharmacy; complex patient with multiple comorbidities including psoriasis, hx CAD (noted hx MI), COPD . Newly established to practice, only temporarily in the area to settle his mother's estate. No insurance. Will be moving away in a little bit and will plan to establish w/ a PCP in that area.  . Most recent eGFR: 93 mL/min o Psoriasis: urgent referral to dermatology. Given first dose of Cosentyx in office, script for compounded triamcinolone ointment. He will be calling the [facility] today to see about mailing it to him. Currently using topical triamcinolone, was unable to pick up clobetasol d/t cost.  o COPD: prescribed albuterol HFA inhaler. Needs spirometry o CAD: reported hx MI.   Pharmacist Clinical Goal(s):  Marland Kitchen Over the next 90 days, patient will work with PharmD and provider towards optimized medication management  Interventions: . Comprehensive medication review performed; medication list updated in electronic medical record . Inter-disciplinary care team collaboration (see longitudinal plan of care) . Discussed [facility] and provided contact information. Patient notes he got a message from them today, he will call them back this afternoon to discuss eligibility information. Once connected, will collaborate w/ PCP to send prescriptions to [facility].  . Will outreach dermatologist to determine long term plan for Cosentyx (office samples vs collaboration w/ [facility] for patient assistance application).  . Moving forward, will address noted hx MI and discuss secondary prevention needs.  Patient Self Care Activities:  . Patient will take medications as prescribed  Initial goal documentation l       Patient  verbalizes understanding of instructions provided today.   Plan: - Will collaborate w/ interdisciplinary team as above  Catie Darnelle Maffucci, PharmD, [place] [phone]

## 2019-10-09 ENCOUNTER — Ambulatory Visit: Payer: Self-pay | Admitting: Pharmacist

## 2019-10-09 ENCOUNTER — Telehealth: Payer: Self-pay

## 2019-10-09 NOTE — Telephone Encounter (Signed)
-----   Message from Willeen Niece, MD sent at 10/08/2019  5:08 PM EDT ----- Baseline prebiologic labs ok.  Neg Hepatitis B/C, TB neg, HIV neg. He can continue Consentyx samples here in office weekly while we await drug approval through [employer] patient assistance. But Glucose is 249- had he just eaten?  Does he have diabetes? Either way, that is very high he should get another test called a HgA1c to further eval for DM.

## 2019-10-09 NOTE — Telephone Encounter (Signed)
Advised pt of labs.  Pt said he had not eaten before labs but had been drinking pepsi which he says he drinks often. Pt also denies having diabetes. I advised we forwarded his labs to his PCP Mardene Celeste NP at [facility].  Also advised Dr. Roseanne Reno requested PCP to do some more labs: fasting glucose and HgA1c.  Advised pt to let us know if he does not hear from Mardene Celeste NP in next few weeks.  Also advised pt to continue Consentyx samples here in office weekly while we await drug approval through [employer] patient assistance./sh

## 2019-10-09 NOTE — Chronic Care Management (AMB) (Signed)
°  Chronic Care Management   Note  10/09/2019 Name: Abass Misener MRN: [id number] DOB: Jun 22, 1965  Devesh Monforte is a 54 y.o. year old male who is a primary care patient of Mardene Celeste I, NP. The CCM team was consulted for assistance with chronic disease management and care coordination needs.    Corresponded w/ dermatologist Dr. Roseanne Reno regarding coordination of care for medication access for Cosentyx. She also noted concerns regarding the cost of labwork that needed to be drawn prior to starting biologic therapy.   Contacted patient to discuss Care Guide referral for support in [facility] assistance plans. Encouraged to call me back at his convenience.   Follow up plan: - F/u call scheduled in ~ 3 weeks  Catie Feliz Beam, PharmD, [facility] Clinical Pharmacist [facility] [phone]

## 2019-10-13 ENCOUNTER — Ambulatory Visit (INDEPENDENT_AMBULATORY_CARE_PROVIDER_SITE_OTHER): Payer: Self-pay

## 2019-10-13 DIAGNOSIS — L400 Psoriasis vulgaris: Secondary | ICD-10-CM

## 2019-10-13 MED ORDER — SECUKINUMAB (300 MG DOSE) 150 MG/ML ~~LOC~~ SOAJ
300.0000 mg | Freq: Once | SUBCUTANEOUS | Status: AC
  Administered 2019-10-13: 300 mg via SUBCUTANEOUS

## 2019-10-13 NOTE — Progress Notes (Signed)
Patient here for second injection of loading dose.  Cosentyx 300mg  injected into left upper arm and left upper thighs for clear areas with no plaque.  LOT:  EXP: 09/2020

## 2019-10-13 NOTE — Patient Instructions (Signed)
Secukinumab injection What is this medicine? SECUKINUMAB (sek ue KIN ue mab) is used to treat psoriasis. It is also used to treat psoriatic arthritis, ankylosing spondylitis, and active non-radiographic axial spondyloarthritis. This medicine may be used for other purposes; ask your health care provider or pharmacist if you have questions. COMMON BRAND NAME(S): Cosentyx What should I tell my health care provider before I take this medicine? They need to know if you have any of these conditions:  Crohn's disease, ulcerative colitis, or other inflammatory bowel disease  immune system problems  infection or history of infection (especially a viral infection such as chickenpox, cold sores, or herpes)  recently received or are scheduled to receive a vaccine  tuberculosis, a positive skin test for tuberculosis, or have recently been in close contact with someone who has tuberculosis  an unusual or allergic reaction to secukinumab, other medicines, latex, rubber, foods, dyes, or preservatives  pregnant or trying to get pregnant  breast-feeding How should I use this medicine? This medicine is for injection under the skin. It may be administered by a healthcare professional in a hospital or clinic setting or at home. If you get this medicine at home, you will be taught how to prepare and give this medicine. Use exactly as directed. Take your medicine at regular intervals. Do not take your medicine more often than directed. It is important that you put your used needles and syringes in a special sharps container. Do not put them in a trash can. If you do not have a sharps container, call your pharmacist or healthcare provider to get one. A special MedGuide will be given to you by the pharmacist with each prescription and refill. Be sure to read this information carefully each time. Talk to your pediatrician regarding the use of this medicine in children. Special care may be needed. Overdosage: If  you think you have taken too much of this medicine contact a poison control center or emergency room at once. NOTE: This medicine is only for you. Do not share this medicine with others. What if I miss a dose? It is important not to miss your dose. Call your doctor of health care professional if you are unable to keep an appointment. If you give yourself the medicine and you miss a dose, take it as soon as you can. If it is almost time for your next dose, take only that dose. Do not take double or extra doses. What may interact with this medicine? Do not take this medicine with any of the following medications:  live virus vaccines This medicine may also interact with the following medications:  cyclosporine  inactivated vaccines  warfarin This list may not describe all possible interactions. Give your health care provider a list of all the medicines, herbs, non-prescription drugs, or dietary supplements you use. Also tell them if you smoke, drink alcohol, or use illegal drugs. Some items may interact with your medicine. What should I watch for while using this medicine? Tell your doctor or healthcare professional if your symptoms do not start to get better or if they get worse. You will be tested for tuberculosis (TB) before you start this medicine. If your doctor prescribes any medicine for TB, you should start taking the TB medicine before starting this medicine. Make sure to finish the full course of TB medicine. This medicine may increase your risk of getting an infection. Call your doctor or health care professional for advice if you get a fever, chills or sore   throat, or other symptoms of a cold or flu. Do not treat yourself. Try to avoid being around people who are sick. This medicine can decrease the response to a vaccine. If you need to get vaccinated, tell your healthcare professional if you have received this medicine within the last 6 months. Extra booster doses may be needed. Talk  to your doctor to see if a different vaccination schedule is needed. What side effects may I notice from receiving this medicine? Side effects that you should report to your doctor or health care professional as soon as possible:  allergic reactions like skin rash, itching or hives, swelling of the face, lips, or tongue  breathing problems  feeling faint or lightheaded  signs and symptoms of bowel problems like abdominal pain, diarrhea, blood in the stool, and weight loss  signs and symptoms of infection like fever or chills; cough; sore throat; pain or trouble passing urine Side effects that usually do not require medical attention (report to your doctor or health care professional if they continue or are bothersome):  pain, redness, or irritation at site where injected  stuffy or runny nose This list may not describe all possible side effects. Call your doctor for medical advice about side effects. You may report side effects to FDA at 1-800-FDA-1088. Where should I keep my medicine? Keep out of the reach of children. Store the prefilled syringe or injection pen in a refrigerator between 2 to 8 degrees C (36 to 46 degrees F). Keep the syringe or the pen in the original carton until ready for use. Protect from light. Do not freeze. Do not shake. Prior to use, remove the syringe or pen from the refrigerator and use within 1 hour. Throw away any unused medicine after the expiration date on the label. NOTE: This sheet is a summary. It may not cover all possible information. If you have questions about this medicine, talk to your doctor, pharmacist, or health care provider.  2020 Elsevier/Gold Standard (2018-10-17 19:12:14) Psoriasis Psoriasis is a long-term (chronic) skin condition. It occurs because your body's defense system (immune system) causes skin cells to form too quickly. This causes raised, red patches (plaques) on your skin that look silvery. The patches may be on all areas of your  body. They can be any size or shape. Psoriasis can come and go. It can range from mild to very bad. It cannot be passed from one person to another (is not contagious). There is no cure for this condition, but it can be helped with treatment. What are the causes? The cause of psoriasis is not known. Some things can make it worse. These are:  Skin damage, such as cuts, scrapes, sunburn, and dryness.  Not getting enough sunlight.  Some medicines.  Alcohol.  Tobacco.  Stress.  Infections. What increases the risk?  Having a family member with psoriasis.  Being very overweight (obese).  Being 15-8 years old.  Taking certain medicines. What are the signs or symptoms? There are different types of psoriasis. The types are:  Plaque. This is the most common. Symptoms include red, raised patches with a silvery coating. These may be itchy. Your nails may be crumbly or fall off.  Guttate. Symptoms include small red spots on your stomach area, arms, and legs. These may happen after you have been sick, such as with strep throat.  Inverse. Symptoms include patches in your armpits, under your breasts, private areas, or on your butt.  Pustular. Symptoms include pus-filled bumps on  the palms of your hands or the soles of your feet. You also may feel very tired, weak, have a fever, and not be hungry.  Erythrodermic. Symptoms include bright red skin that looks burned. You may have a fast heartbeat and a body temperature that is too high or too low. You may be itchy or in pain.  Sebopsoriasis. Symptoms include red patches on your scalp, forehead, and face that are greasy.  Psoriatic arthritis. Symptoms include swollen, painful joints along with scaly skin patches. How is this treated? There is no cure for this condition, but treatment can:  Help your skin heal.  Lessen itching and irritation and swelling (inflammation).  Slow the growth of new skin cells.  Help your body's defense  system respond better to your skin. Treatment may include:  Creams or ointments.  Light therapy. This may include natural sunlight or light therapy in a doctor's office.  Medicines. These can help your body better manage skin cells. They may be used with light therapy or ointments. Medicines may include pills or injections. You may also get antibiotic medicines if you have an infection. Follow these instructions at home: Skin Care  Apply lotion to your skin as needed. Only use those that your doctor has said are okay.  Apply cool, wet cloths (cold compresses) to the affected areas.  Do not use a hot tub or take hot showers. Use slightly warm, not hot, water when taking showers and baths.  Do not scratch your skin. Lifestyle   Do not use any products that contain nicotine or tobacco, such as cigarettes, e-cigarettes, and chewing tobacco. If you need help quitting, ask your doctor.  Lower your stress.  Keep a healthy weight.  Go out in the sun as told by your doctor. Do not get sunburned.  Join a support group. Medicines  Take or use over-the-counter and prescription medicines only as told by your doctor.  If you were prescribed an antibiotic medicine, take it as told by your doctor. Do not stop using the antibiotic even if you start to feel better. Alcohol use If you drink alcohol:  Limit how much you use: ? 0-1 drink a day for women. ? 0-2 drinks a day for men.  Be aware of how much alcohol is in your drink. In the [place] one drink equals one 12 oz bottle of beer (355 mL), one 5 oz glass of wine (148 mL), or one 1 oz glass of hard liquor (44 mL). General instructions  Keep a journal to track the things that cause symptoms (triggers). Try to avoid these things.  See a counselor if you feel the support would help.  Keep all follow-up visits as told by your doctor. This is important. Contact a doctor if:  You have a fever.  Your pain gets worse.  You have more  redness or warmth in the affected areas.  You have new or worse pain or stiffness in your joints.  Your nails start to break easily or pull away from the nail bed.  You feel very sad (depressed). Summary  Psoriasis is a long-term (chronic) skin condition.  There is no cure for this condition, but treatment can help manage it.  Keep a journal to track the things that cause symptoms.  Take or use over-the-counter and prescription medicines only as told by your doctor.  Keep all follow-up visits as told by your doctor. This is important. This information is not intended to replace advice given to you by  your health care provider. Make sure you discuss any questions you have with your health care provider. Document Revised: 02/19/2018 Document Reviewed: 02/19/2018 Elsevier Patient Education  2020 [employer].

## 2019-10-15 ENCOUNTER — Encounter: Payer: Self-pay | Admitting: Dermatology

## 2019-10-20 ENCOUNTER — Encounter: Payer: Self-pay | Admitting: Nurse Practitioner

## 2019-10-20 ENCOUNTER — Ambulatory Visit (INDEPENDENT_AMBULATORY_CARE_PROVIDER_SITE_OTHER): Payer: Self-pay | Admitting: Nurse Practitioner

## 2019-10-20 ENCOUNTER — Other Ambulatory Visit: Payer: Self-pay

## 2019-10-20 ENCOUNTER — Ambulatory Visit: Payer: Self-pay

## 2019-10-20 VITALS — BP 116/75 | HR 97 | Temp 98.1°F | Ht 71.0 in | Wt 232.2 lb

## 2019-10-20 DIAGNOSIS — M7989 Other specified soft tissue disorders: Secondary | ICD-10-CM

## 2019-10-20 DIAGNOSIS — R739 Hyperglycemia, unspecified: Secondary | ICD-10-CM

## 2019-10-20 DIAGNOSIS — L400 Psoriasis vulgaris: Secondary | ICD-10-CM

## 2019-10-20 DIAGNOSIS — I2510 Atherosclerotic heart disease of native coronary artery without angina pectoris: Secondary | ICD-10-CM

## 2019-10-20 MED ORDER — ASPIRIN EC 81 MG PO TBEC
81.0000 mg | DELAYED_RELEASE_TABLET | Freq: Every day | ORAL | 1 refills | Status: AC
Start: 2019-10-20 — End: ?

## 2019-10-20 MED ORDER — PRAVASTATIN SODIUM 40 MG PO TABS
40.0000 mg | ORAL_TABLET | Freq: Every day | ORAL | 1 refills | Status: DC
Start: 2019-10-20 — End: 2019-11-06

## 2019-10-20 NOTE — Progress Notes (Signed)
BP 116/75 (BP Location: Left Arm, Patient Position: Sitting, Cuff Size: Normal)   Pulse 97   Temp 98.1 F (36.7 C) (Oral)   Ht 5\' 11"  (1.803 m)   Wt 232 lb 3.2 oz (105.3 kg)   SpO2 100%   BMI 32.39 kg/m    Subjective:    Patient ID: Jacob Owen, male    DOB: [birth year], 54 y.o.   MRN: [id number]  HPI: Jacob Owen is a 54 y.o. male presenting for follow up.  Chief Complaint  Patient presents with  . Edema    Severe swelling in ankles and feet.   . Psoriasis  . Respiratory Disease   PSORIASIS Patient states that his psoriasis is improving.  He has a follow-up with the dermatologist tomorrow to look at his hand and feet.  He is concerned because they do not seem to be getting much better. He reports he is still living in the same living situation and has not been able to shower or bathe except for bird baths.  He reports applying the cream as instructed. Duration: years Location: everywhere Painful: no Itching: yes Onset: gradual Context: smaller Associated signs and symptoms: chills History of skin cancer: no History of precancerous skin lesions: no Family history of skin cancer: no  LEG SWELLING Patient reports leg swelling that has been going on for about 1 month.  The swelling does not seem to be affected by elevation.  The patient reports he noticed it right before his first office visit with our clinic.  He does not think it is related to his psoriasis. Duration: months Pain: yes Severity: severe  Quality:  Constant ache Location: knee to toes  Bilateral:  yes Onset: sudden Frequency: constant Time of  day:   at random Sudden unintentional leg jerking:   yes Paresthesias:   no Decreased sensation:  no Weakness:   no Insomnia:   no Fatigue:   no Alleviating factors: taking off shoes Aggravating factors: sleeping on side of hurt leg Status: worse Treatments attempted: elevation  Patient reports increased nausea and decreased appetite lately.   Reports not eating very much and only eating dinner.  He only drinks Pepsi and lemonade slushies.  He does not drink any water and reports being very picky about which vegetables he eats.  He does not prepare food at home and eats out 100% of his meals.  Allergies  Allergen Reactions  . Chocolate     Other reaction(s): ANAPHYLAXIS  . Codeine Nausea And Vomiting  . Other Rash    Bed bugs.   . Penicillins Itching   Outpatient Encounter Medications as of 10/20/2019  Medication Sig  . albuterol (VENTOLIN HFA) 108 (90 Base) MCG/ACT inhaler Inhale 2 puffs into the lungs every 6 (six) hours as needed.  . cetirizine (ZYRTEC) 10 MG tablet Take 1 tablet (10 mg total) by mouth daily.  . clobetasol ointment (TEMOVATE) 0.05 % Apply 1 application topically 2 (two) times daily.  . Coal Tar Extract (PSORIASIN EX) Apply 1 application topically daily as needed.  . TRIAMCINOLONE ACETONIDE, TOP, (TRIANEX) 0.05 % OINT Apply twice a day to affected areas psoriasis. Avoid face, groin, underarms.  . triamcinolone cream (KENALOG) 0.1 % Apply 1 application topically 2 (two) times daily. (Patient not taking: Reported on 10/20/2019)  . [DISCONTINUED] predniSONE (DELTASONE) 10 MG tablet Take 6 tablets by mouth daily for 2 days, then reduce by 1 tablet every 2 days until gone (Patient not taking: Reported on 10/20/2019)   No  facility-administered encounter medications on file as of 10/20/2019.   Patient Active Problem List   Diagnosis Date Noted  . Leg swelling 10/20/2019  . Elevated serum glucose 10/20/2019  . Hyperlipidemia 04/19/2014  . Plaque psoriasis 04/19/2014  . Suspected respiratory disease 04/19/2014  . Coronary artery disease involving native coronary artery of native heart without angina pectoris 04/17/2014  . Tobacco use disorder 04/17/2014   Past Medical History:  Diagnosis Date  . Alcoholism (HCC)   . Ankle fracture    Left  . Arm fracture, right   . Bradycardia   . Chronic venous stasis  dermatitis of both lower extremities   . Collar bone fracture   . Concussion    x's 7. (Younger)  . COPD (chronic obstructive pulmonary disease) (HCC)   . Coronary artery disease involving native coronary artery of native heart without angina pectoris   . Hyperlipidemia   . Myocardial infarction (HCC)   . Obesity   . Plaque psoriasis   . Right wrist fracture   . Suspected respiratory disease   . Tailbone injury   . Tobacco use disorder    Relevant past medical, surgical, family and social history reviewed and updated as indicated. Interim medical history since our last visit reviewed.  Review of Systems  Constitutional: Positive for appetite change, chills and unexpected weight change. Negative for activity change, fatigue and fever.  Respiratory: Negative.  Negative for cough, chest tightness, shortness of breath and wheezing.   Cardiovascular: Positive for leg swelling. Negative for chest pain and palpitations.  Gastrointestinal: Negative.   Endocrine: Positive for cold intolerance. Negative for polydipsia, polyphagia and polyuria.  Genitourinary: Positive for decreased urine volume. Negative for difficulty urinating, dysuria, flank pain, frequency and urgency.  Musculoskeletal: Positive for joint swelling (ankles) and myalgias (ankles and feet).  Skin: Positive for rash (psoriasis).  Neurological: Negative.  Negative for weakness.  Hematological: Negative.   Psychiatric/Behavioral: Negative.  Negative for sleep disturbance.    Per HPI unless specifically indicated above     Objective:    BP 116/75 (BP Location: Left Arm, Patient Position: Sitting, Cuff Size: Normal)   Pulse 97   Temp 98.1 F (36.7 C) (Oral)   Ht 5\' 11"  (1.803 m)   Wt 232 lb 3.2 oz (105.3 kg)   SpO2 100%   BMI 32.39 kg/m   Wt Readings from Last 3 Encounters:  10/20/19 232 lb 3.2 oz (105.3 kg)  09/26/19 243 lb (110.2 kg)  09/19/19 242 lb (109.8 kg)    Physical Exam Vitals and nursing note reviewed.   Constitutional:      General: He is not in acute distress.    Appearance: Normal appearance. He is not toxic-appearing.  Eyes:     General: No scleral icterus.    Extraocular Movements: Extraocular movements intact.  Cardiovascular:     Rate and Rhythm: Normal rate and regular rhythm.     Heart sounds: Normal heart sounds. No murmur heard.   Pulmonary:     Effort: Pulmonary effort is normal. No respiratory distress.     Breath sounds: Normal breath sounds. No wheezing or rhonchi.  Abdominal:     General: Abdomen is flat.     Palpations: Abdomen is soft.  Musculoskeletal:        General: Swelling and tenderness present. Normal range of motion.     Right lower leg: Edema present.     Left lower leg: Edema present.  Skin:    Findings: Lesion (plaque psoriasis covering large  portion of body) present.  Neurological:     General: No focal deficit present.     Mental Status: He is alert and oriented to person, place, and time.     Motor: No weakness.     Gait: Gait normal.  Psychiatric:        Mood and Affect: Mood normal.        Thought Content: Thought content normal.        Judgment: Judgment normal.     Results for orders placed or performed in visit on 10/06/19  Comprehensive metabolic panel  Result Value Ref Range   Glucose 249 (H) 65 - 99 mg/dL   BUN 6 6 - 24 mg/dL   Creatinine, Ser 4.78 0.76 - 1.27 mg/dL   GFR calc non Af Amer 81 >59 mL/min/1.73   GFR calc Af Amer 93 >59 mL/min/1.73   BUN/Creatinine Ratio 6 (L) 9 - 20   Sodium 137 134 - 144 mmol/L   Potassium 4.4 3.5 - 5.2 mmol/L   Chloride 101 96 - 106 mmol/L   CO2 20 20 - 29 mmol/L   Calcium 8.5 (L) 8.7 - 10.2 mg/dL   Total Protein 6.5 6.0 - 8.5 g/dL   Albumin 3.6 (L) 3.8 - 4.9 g/dL   Globulin, Total 2.9 1.5 - 4.5 g/dL   Albumin/Globulin Ratio 1.2 1.2 - 2.2   Bilirubin Total 0.9 0.0 - 1.2 mg/dL   Alkaline Phosphatase 89 48 - 121 IU/L   AST 20 0 - 40 IU/L   ALT 26 0 - 44 IU/L  CBC with Differential/Platelet   Result Value Ref Range   WBC 12.6 (H) 3.4 - 10.8 x10E3/uL   RBC 4.50 4.14 - 5.80 x10E6/uL   Hemoglobin 14.2 13.0 - 17.7 g/dL   Hematocrit 29.5 62.1 - 51.0 %   MCV 95 79 - 97 fL   MCH 31.6 26.6 - 33.0 pg   MCHC 33.3 31 - 35 g/dL   RDW 30.8 65.7 - 84.6 %   Platelets 311 150 - 450 x10E3/uL   Neutrophils 81 Not Estab. %   Lymphs 11 Not Estab. %   Monocytes 7 Not Estab. %   Eos 1 Not Estab. %   Basos 0 Not Estab. %   Neutrophils Absolute 10.1 (H) 1 - 7 x10E3/uL   Lymphocytes Absolute 1.4 0 - 3 x10E3/uL   Monocytes Absolute 0.9 0 - 0 x10E3/uL   EOS (ABSOLUTE) 0.2 0.0 - 0.4 x10E3/uL   Basophils Absolute 0.0 0 - 0 x10E3/uL   Immature Granulocytes 0 Not Estab. %   Immature Grans (Abs) 0.0 0.0 - 0.1 x10E3/uL  Hepatitis B surface antibody,qualitative  Result Value Ref Range   Hep B Surface Ab, Qual Non Reactive   Hepatitis B surface antigen  Result Value Ref Range   Hepatitis B Surface Ag Negative Negative  Hepatitis C antibody  Result Value Ref Range   Hep C Virus Ab <0.1 0.0 - 0.9 s/co ratio  HIV Antibody (routine testing w rflx)  Result Value Ref Range   HIV Screen 4th Generation wRfx Non Reactive Non Reactive  QuantiFERON-TB Gold Plus  Result Value Ref Range   QuantiFERON Incubation Incubation performed.    QuantiFERON Criteria Comment    QuantiFERON TB1 Ag Value 0.00 IU/mL   QuantiFERON TB2 Ag Value 0.01 IU/mL   QuantiFERON Nil Value 0.00 IU/mL   QuantiFERON Mitogen Value >10.00 IU/mL   QuantiFERON-TB Gold Plus Negative Negative      Assessment & Plan:  Problem List Items Addressed This Visit      Cardiovascular and Mediastinum   Coronary artery disease involving native coronary artery of native heart without angina pectoris    Chronic, ongoing.  Will start pravastatin 40 mg and aspirin 81 mg daily due to history of MI and increased ASCVD risk.  Patient denies chest pain, shortness of breath today and blood pressure and heart rate stable.  Will defer testing lipids  until a later date due to self-pay status.  All medications sent to medication management clinic per collaboration with pharmacist.        Musculoskeletal and Integument   Plaque psoriasis    Chronic, ongoing.  Continue to follow with dermatologist and their recommendations.  Next appointment tomorrow for next subcutaneous injection of biologic and evaluation of concerning features of hands and feet.        Other   Leg swelling - Primary    Acute, ongoing.  With subjective short duration, will check BNP, kidney function, electrolytes, and blood counts.  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  Blood pressure and heart rate and temperature are stable today.  Advised to elevate legs at home.  Follow labs.      Relevant Orders   TSH   Comprehensive metabolic panel   B Nat Peptide   Elevated serum glucose    Noted on recent labs with dermatology office.  Will check A1c today and follow.      Relevant Orders   HgB A1c   Comprehensive metabolic panel   CBC with Differential/Platelet       Follow up plan: Return in about 4 weeks (around 11/17/2019) for follow up.

## 2019-10-20 NOTE — Patient Instructions (Signed)
Nice seeing you again today, Jacob Owen!  Be sure to pick up your prescriptions at [address] [place].  We will let you know about your lab results tomorrow and later this week.  See you in about 4 weeks. Edema  Edema is when you have too much fluid in your body or under your skin. Edema may make your legs, feet, and ankles swell up. Swelling is also common in looser tissues, like around your eyes. This is a common condition. It gets more common as you get older. There are many possible causes of edema. Eating too much salt (sodium) and being on your feet or sitting for a long time can cause edema in your legs, feet, and ankles. Hot weather may make edema worse. Edema is usually painless. Your skin may look swollen or shiny. Follow these instructions at home:  Keep the swollen body part raised (elevated) above the level of your heart when you are sitting or lying down.  Do not sit still or stand for a long time.  Do not wear tight clothes. Do not wear garters on your upper legs.  Exercise your legs. This can help the swelling go down.  Wear elastic bandages or support stockings as told by your doctor.  Eat a low-salt (low-sodium) diet to reduce fluid as told by your doctor.  Depending on the cause of your swelling, you may need to limit how much fluid you drink (fluid restriction).  Take over-the-counter and prescription medicines only as told by your doctor. Contact a doctor if:  Treatment is not working.  You have heart, liver, or kidney disease and have symptoms of edema.  You have sudden and unexplained weight gain. Get help right away if:  You have shortness of breath or chest pain.  You cannot breathe when you lie down.  You have pain, redness, or warmth in the swollen areas.  You have heart, liver, or kidney disease and get edema all of a sudden.  You have a fever and your symptoms get worse all of a sudden. Summary  Edema is when you have too  much fluid in your body or under your skin.  Edema may make your legs, feet, and ankles swell up. Swelling is also common in looser tissues, like around your eyes.  Raise (elevate) the swollen body part above the level of your heart when you are sitting or lying down.  Follow your doctor's instructions about diet and how much fluid you can drink (fluid restriction). This information is not intended to replace advice given to you by your health care provider. Make sure you discuss any questions you have with your health care provider. Document Revised: 04/20/2017 Document Reviewed: 05/05/2016 Elsevier Patient Education  2020 [employer].

## 2019-10-20 NOTE — Assessment & Plan Note (Signed)
Chronic, ongoing.  Continue to follow with dermatologist and their recommendations.  Next appointment tomorrow for next subcutaneous injection of biologic and evaluation of concerning features of hands and feet.

## 2019-10-20 NOTE — Assessment & Plan Note (Signed)
Noted on recent labs with dermatology office.  Will check A1c today and follow.

## 2019-10-20 NOTE — Assessment & Plan Note (Signed)
Acute, ongoing.  With subjective short duration, will check BNP, kidney function, electrolytes, and blood counts.  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  Blood pressure and heart rate and temperature are stable today.  Advised to elevate legs at home.  Follow labs.

## 2019-10-20 NOTE — Assessment & Plan Note (Addendum)
Chronic, ongoing.  Will start pravastatin 40 mg and aspirin 81 mg daily due to history of MI and increased ASCVD risk.  Patient denies chest pain, shortness of breath today and blood pressure and heart rate stable.  Will defer testing lipids until a later date due to self-pay status.  All medications sent to medication management clinic per collaboration with pharmacist.

## 2019-10-21 ENCOUNTER — Other Ambulatory Visit: Payer: Self-pay

## 2019-10-21 ENCOUNTER — Ambulatory Visit: Payer: Self-pay | Admitting: Pharmacist

## 2019-10-21 ENCOUNTER — Ambulatory Visit (INDEPENDENT_AMBULATORY_CARE_PROVIDER_SITE_OTHER): Payer: Self-pay | Admitting: Dermatology

## 2019-10-21 DIAGNOSIS — L539 Erythematous condition, unspecified: Secondary | ICD-10-CM

## 2019-10-21 DIAGNOSIS — J449 Chronic obstructive pulmonary disease, unspecified: Secondary | ICD-10-CM | POA: Insufficient documentation

## 2019-10-21 DIAGNOSIS — L400 Psoriasis vulgaris: Secondary | ICD-10-CM

## 2019-10-21 DIAGNOSIS — L409 Psoriasis, unspecified: Secondary | ICD-10-CM

## 2019-10-21 LAB — COMPREHENSIVE METABOLIC PANEL
ALT: 75 IU/L — ABNORMAL HIGH (ref 0–44)
AST: 49 IU/L — ABNORMAL HIGH (ref 0–40)
Albumin/Globulin Ratio: 1.2 (ref 1.2–2.2)
Albumin: 3.7 g/dL — ABNORMAL LOW (ref 3.8–4.9)
Alkaline Phosphatase: 157 IU/L — ABNORMAL HIGH (ref 48–121)
BUN/Creatinine Ratio: 6 — ABNORMAL LOW (ref 9–20)
BUN: 6 mg/dL (ref 6–24)
Bilirubin Total: 0.7 mg/dL (ref 0.0–1.2)
CO2: 21 mmol/L (ref 20–29)
Calcium: 8.8 mg/dL (ref 8.7–10.2)
Chloride: 100 mmol/L (ref 96–106)
Creatinine, Ser: 1.02 mg/dL (ref 0.76–1.27)
GFR calc Af Amer: 95 mL/min/{1.73_m2} (ref >59)
GFR calc non Af Amer: 82 mL/min/{1.73_m2} (ref >59)
Globulin, Total: 3 g/dL (ref 1.5–4.5)
Glucose: 169 mg/dL — ABNORMAL HIGH (ref 65–99)
Potassium: 4.1 mmol/L (ref 3.5–5.2)
Sodium: 138 mmol/L (ref 134–144)
Total Protein: 6.7 g/dL (ref 6.0–8.5)

## 2019-10-21 LAB — HEMOGLOBIN A1C
Est. average glucose Bld gHb Est-mCnc: 174 mg/dL
Hgb A1c MFr Bld: 7.7 % — ABNORMAL HIGH (ref 4.8–5.6)

## 2019-10-21 LAB — CBC WITH DIFFERENTIAL/PLATELET
Basophils Absolute: 0.1 10*3/uL (ref 0.0–0.2)
Basos: 1 %
EOS (ABSOLUTE): 0.4 10*3/uL (ref 0.0–0.4)
Eos: 5 %
Hematocrit: 42.6 % (ref 37.5–51.0)
Hemoglobin: 14.1 g/dL (ref 13.0–17.7)
Immature Grans (Abs): 0 10*3/uL (ref 0.0–0.1)
Immature Granulocytes: 0 %
Lymphocytes Absolute: 1.7 10*3/uL (ref 0.7–3.1)
Lymphs: 20 %
MCH: 31.4 pg (ref 26.6–33.0)
MCHC: 33.1 g/dL (ref 31.5–35.7)
MCV: 95 fL (ref 79–97)
Monocytes Absolute: 0.7 10*3/uL (ref 0.1–0.9)
Monocytes: 9 %
Neutrophils Absolute: 5.8 10*3/uL (ref 1.4–7.0)
Neutrophils: 65 %
Platelets: 313 10*3/uL (ref 150–450)
RBC: 4.49 x10E6/uL (ref 4.14–5.80)
RDW: 13.2 % (ref 11.6–15.4)
WBC: 8.7 10*3/uL (ref 3.4–10.8)

## 2019-10-21 LAB — BRAIN NATRIURETIC PEPTIDE: BNP: 66.6 pg/mL (ref 0.0–100.0)

## 2019-10-21 LAB — TSH: TSH: 3.08 u[IU]/mL (ref 0.450–4.500)

## 2019-10-21 MED ORDER — SECUKINUMAB 150 MG/ML ~~LOC~~ SOSY
300.0000 mg | PREFILLED_SYRINGE | Freq: Once | SUBCUTANEOUS | Status: AC
  Administered 2019-10-21: 300 mg via SUBCUTANEOUS

## 2019-10-21 NOTE — Progress Notes (Addendum)
   Follow-Up Visit   Subjective  Jacob Owen is a 54 y.o. male who presents for the following: Psoriasis.  Patient here today for psoriasis follow up. He is here today for the third injections of Cosentyx loading dose. He is using TMC 0.1% cream topically and has done some wet wraps at hands, but hasn't done it on his body. Patient advises he is having some other medical issues with weight loss, decreased urination, swelling, and also increased blood sugars and is being evaluated by his PCP. He says that these issues didn't start until he started the Cosentyx. Patient also concerned that when his skin peels, multiple layers are coming off at feet and hands. He is also noticing splits in the bottoms of his feet.  They are very painful. Overall patient feels that he has had some improvement in his skin- plaques aren't as thick, but he is feeling worse as far as general overall symptoms with chills, weight loss, and decreased urination.  Patient has h/o cardiac disease.  The following portions of the chart were reviewed this encounter and updated as appropriate:      Review of Systems:  No other skin or systemic complaints except as noted in HPI or Assessment and Plan.  Objective  Well appearing patient in no apparent distress; mood and affect are within normal limits.  A focused examination was performed including arms, hands, legs, feet. Relevant physical exam findings are noted in the Assessment and Plan.  Objective  Trunk, extremities: Diffuse erythema with scaling over trunk and extremities, hyperkeratosis at feet, hands and scalp.  Patient is visibly shaking from chills. Recent labs from yesterday show normal renal function.  Blood sugar still elevated,  Lfts are elevated- Pt denies recent alcohol use.   Assessment & Plan  Psoriasis Trunk, extremities  With erythroderma and systemic symptoms  Continue Cosentyx 150mg /mL loading dose- 3rd Injection today.  Samples given. Pt  submitted income paperwork today for [employer] patient assistance for Consentyx since he is not insured.  Recommend patient go to ER at [facility] due to systemic symptoms associated with erythroderma.  Will need in patient wet wraps and IV hydration.  Pt has underlying DM (new onset?) that needs to be addressed as well.  cetirizine (ZYRTEC) 10 MG tablet - Trunk, extremities  clobetasol ointment (TEMOVATE) 0.05 % - Trunk, extremities  TRIAMCINOLONE ACETONIDE, TOP, (TRIANEX) 0.05 % OINT - Trunk, extremities  Return in about 1 week (around 10/28/2019) for Cosentyx loading dose with nurse.  10/30/2019, RMA, am acting as scribe for Anise Salvo, MD .  Documentation: I have reviewed the above documentation for accuracy and completeness, and I agree with the above.  Willeen Niece MD

## 2019-10-21 NOTE — Chronic Care Management (AMB) (Signed)
  Chronic Care Management   Note  10/21/2019 Name: Jacob Owen MRN: [id number] DOB: 05/13/1965  Jacob Owen is a 54 y.o. year old male who is a primary care patient of Mardene Celeste I, NP. The CCM team was consulted for assistance with chronic disease management and care coordination needs.    Attempted to contact patient to discuss medication access and Care Guide referral regarding housing situation. Patient noted that he is being admitted to [facility] for erythroderma per Dr. Roseanne Reno. Will follow for discharge.   Catie Feliz Beam, PharmD, [facility] Clinical Pharmacist [facility] [phone]

## 2019-10-24 ENCOUNTER — Ambulatory Visit: Payer: Self-pay | Admitting: Pharmacist

## 2019-10-24 DIAGNOSIS — L400 Psoriasis vulgaris: Secondary | ICD-10-CM

## 2019-10-24 DIAGNOSIS — I2510 Atherosclerotic heart disease of native coronary artery without angina pectoris: Secondary | ICD-10-CM

## 2019-10-24 NOTE — Chronic Care Management (AMB) (Signed)
°  Chronic Care Management   Note  10/24/2019 Name: Jacob Owen MRN: [id number] DOB: April 26, 1966  Jacob Owen is a 54 y.o. year old male who is a primary care patient of Mardene Celeste I, NP. The CCM team was consulted for assistance with chronic disease management and care coordination needs.    Patient currently admitted at [facility] for tx erythroderma. Uninsured, will need support w/ financial barriers regarding medical costs. Placing Care Guide referral. Notified patient via MyChart.   Catie Feliz Beam, PharmD, [facility] Clinical Pharmacist [facility] [phone]

## 2019-10-25 ENCOUNTER — Encounter: Payer: Self-pay | Admitting: Dermatology

## 2019-10-27 ENCOUNTER — Telehealth: Payer: Self-pay | Admitting: Nurse Practitioner

## 2019-10-27 ENCOUNTER — Ambulatory Visit: Payer: Self-pay | Admitting: Dermatology

## 2019-10-27 NOTE — Telephone Encounter (Signed)
-----   Message from Wells Guiles, NP sent at 10/26/2019  8:27 PM EDT ----- Looks like patient was discharged from [facility] on 10/24/2019, please reach out to him to schedule an ER follow up.

## 2019-10-27 NOTE — Telephone Encounter (Signed)
lvm to make this apt.  

## 2019-10-28 ENCOUNTER — Ambulatory Visit (INDEPENDENT_AMBULATORY_CARE_PROVIDER_SITE_OTHER): Payer: Self-pay | Admitting: Dermatology

## 2019-10-28 ENCOUNTER — Telehealth: Payer: Self-pay

## 2019-10-28 ENCOUNTER — Other Ambulatory Visit: Payer: Self-pay

## 2019-10-28 DIAGNOSIS — L409 Psoriasis, unspecified: Secondary | ICD-10-CM

## 2019-10-28 NOTE — Telephone Encounter (Signed)
[facility] to schedule patient in the [place] office. He has an appointment on 12/11/19 with Dr. Dorna Bloom. I called patient to advise him of appointment. Patient is scheduled to see his PCP this Thursday for an ER follow up. He will have them remove stitch from biopsy done in ER.

## 2019-10-28 NOTE — Progress Notes (Signed)
   Follow-Up Visit   Subjective  Jacob Owen is a 54 y.o. male who presents for the following: Follow-up.  Patient here today for psoriasis follow up and 4th injection of Cosentyx loading dose. He is also using TMC 0.1% cream and clobetasol 0.05% ointment. Patient was sent to [facility] last week following his visit with [place] due to severity of erythroderma. He was admitted and stayed for 4 days while being treated with wet wraps and cyclosporine. He is much improved. Recommend patient make an appointment with [facility] in [place] so they can manage the cyclosporin.  Patient states that they have him on patient assistance through [facility], and so his visits and labs would be covered, including his hospitalization.  Patient advises that he will be getting Cosentyx shipped to his house through [employer].   The following portions of the chart were reviewed this encounter and updated as appropriate:      Review of Systems:  No other skin or systemic complaints except as noted in HPI or Assessment and Plan.  Objective  Well appearing patient in no apparent distress; mood and affect are within normal limits.  A focused examination was performed including trunk, arms, legs, scalp. Relevant physical exam findings are noted in the Assessment and Plan.  Objective  Trunk, extremities: Generalized erythema with decreased scaling on legs, arms, scalp. Mild erythema and scale on trunk.  Overall much improved   Assessment & Plan  Psoriasis Trunk, extremities  With erythroderma, much improved post hospitalization at [facility] with cyclosporin and wet wraps.  Continue TMC 0.1% cream bid to body- pt has. Continue HC 2.5% cream bid to face, groin, axilla as needed   Will reach out to [facility] to transfer his care to them since all his visits, meds, and labs are covered through [facility] patient assistance program.  Multiple labs will be required while he is on Cyclosporin to monitor renal function, and  this will be a more affordable option for him.  He was scheduled with Dr. Dorna Bloom on 12/11/19 here in [place].  In the meantime, he will be seeing his PCP for ER f/up and labs on 7/1 and 7/26.  Pt has been approved for free Cosentyx through [employer] patient assistance and he has received his first injections in the mail. Recommend he continue the loading dose 300 mg SQ weekly x 5 weeks, then 300 mg SQ monthly.  This will be used as a bridge to allow for potential discontinuation of Cyclosporin due to potential for renal side effects with its long term use.  cetirizine (ZYRTEC) 10 MG tablet - Trunk, extremities  clobetasol ointment (TEMOVATE) 0.05 % - Trunk, extremities  TRIAMCINOLONE ACETONIDE, TOP, (TRIANEX) 0.05 % OINT - Trunk, extremities  Return if symptoms worsen or fail to improve, for or as scheduled 12/01/19.  Anise Salvo, RMA, am acting as scribe for Willeen Niece, MD .  Documentation: I have reviewed the above documentation for accuracy and completeness, and I agree with the above.  Willeen Niece MD

## 2019-10-30 ENCOUNTER — Other Ambulatory Visit: Payer: Self-pay

## 2019-10-30 ENCOUNTER — Ambulatory Visit (INDEPENDENT_AMBULATORY_CARE_PROVIDER_SITE_OTHER): Payer: Self-pay | Admitting: Nurse Practitioner

## 2019-10-30 ENCOUNTER — Encounter: Payer: Self-pay | Admitting: Nurse Practitioner

## 2019-10-30 VITALS — BP 172/80 | HR 65 | Temp 98.0°F | Ht 69.49 in | Wt 234.1 lb

## 2019-10-30 DIAGNOSIS — R030 Elevated blood-pressure reading, without diagnosis of hypertension: Secondary | ICD-10-CM

## 2019-10-30 DIAGNOSIS — E1165 Type 2 diabetes mellitus with hyperglycemia: Secondary | ICD-10-CM

## 2019-10-30 DIAGNOSIS — I2510 Atherosclerotic heart disease of native coronary artery without angina pectoris: Secondary | ICD-10-CM

## 2019-10-30 DIAGNOSIS — L400 Psoriasis vulgaris: Secondary | ICD-10-CM

## 2019-10-30 DIAGNOSIS — I152 Hypertension secondary to endocrine disorders: Secondary | ICD-10-CM | POA: Insufficient documentation

## 2019-10-30 MED ORDER — METFORMIN HCL 500 MG PO TABS: 500.0000 mg | ORAL_TABLET | Freq: Two times a day (BID) | ORAL | 1 refills | Status: DC

## 2019-10-30 NOTE — Assessment & Plan Note (Signed)
Chronic, ongoing.  EKG today showed frequent PACs - this is likely what was seen on the screen in the endoscopy suite.  Will generate referral to Cardiology today with [facility] charity care for both this and ongoing monitoring/recommendations for previous myocardial infarction.

## 2019-10-30 NOTE — Patient Instructions (Signed)
Metformin; Pioglitazone tablets What is this medicine? METFORMIN; PIOGLITAZONE (met FOR min; pye oh GLI ta zone) helps to treat type 2 diabetes. It helps to control blood sugar. Treatment is combined with diet and exercise. This medicine may be used for other purposes; ask your health care provider or pharmacist if you have questions. COMMON BRAND NAME(S): Actoplus Met What should I tell my health care provider before I take this medicine? They need to know if you have any of these conditions:  anemia  bladder cancer  dehydration  eye disease, vision problems  heart disease  heart failure  if you often drink alcohol  kidney disease  liver disease  polycystic ovary syndrome  serious infection or injury  vomiting  an unusual or allergic reaction to metformin, pioglitazone, other medicines, foods, dyes, or preservatives  pregnant or trying to get pregnant  breast-feeding How should I use this medicine? Take this medicine by mouth with a glass of water. Follow the directions on the prescription label. Take this medicine with food. Take your doses at regular intervals. Do not take your medicine more often than directed. Do not stop taking except on your doctor's advice. A special MedGuide will be given to you by the pharmacist with each prescription and refill. Be sure to read this information carefully each time. Talk to your pediatrician regarding the use of this medicine in children. Special care may be needed. Overdosage: If you think you have taken too much of this medicine contact a poison control center or emergency room at once. NOTE: This medicine is only for you. Do not share this medicine with others. What if I miss a dose? If you miss a dose, take it as soon as you can. If it is almost time for your next dose, take only that dose. Do not take double or extra doses. What may interact with this medicine? Do not take this medicine with any of the following  medications:  certain contrast medicines given before X-rays, CT scans, MRI, or other procedures  dofetilide This medicine may also interact with the following medications:  acetazolamide  alcohol  atorvastatin  certain antivirals for HIV or hepatitis  certain medicines for blood pressure, heart disease, irregular heart beat  cimetidine  dichlorphenamide  digoxin  diuretics  male hormones, like estrogens or progestins and birth control pills  gemfibrozil  glycopyrrolate  insulin  isoniazid  ketoconazole  lamotrigine  memantine  methazolamide  metoclopramide  midazolam  midodrine  niacin  phenothiazines like chlorpromazine, mesoridazine, prochlorperazine, thioridazine  phenytoin  ranolazine  rifampin  steroid medicines like prednisone or cortisone  stimulant medicines for attention disorders, weight loss, or to stay awake  thyroid medicines  topiramate  trospium  vandetanib  zonisamide This list may not describe all possible interactions. Give your health care provider a list of all the medicines, herbs, non-prescription drugs, or dietary supplements you use. Also tell them if you smoke, drink alcohol, or use illegal drugs. Some items may interact with your medicine. What should I watch for while using this medicine? Visit your doctor or health care professional for regular checks on your progress. A test called the HbA1C (A1C) will be monitored. This is a simple blood test. It measures your blood sugar control over the last 2 to 3 months. You will receive this test every 3 to 6 months. Learn how to check your blood sugar. Learn the symptoms of low and high blood sugar and how to manage them. Always carry a quick-source  of sugar with you in case you have symptoms of low blood sugar. Examples include hard sugar candy or glucose tablets. Make sure others know that you can choke if you eat or drink when you develop serious symptoms of low  blood sugar, such as seizures or unconsciousness. They must get medical help at once. Tell your doctor or health care professional if you have high blood sugar. You might need to change the dose of your medicine. If you are sick or exercising more than usual, you might need to change the dose of your medicine. Do not skip meals. Ask your doctor or health care professional if you should avoid alcohol. Many nonprescription cough and cold products contain sugar or alcohol. These can affect blood sugar. If you need surgery or if you will need a procedure with contrast drugs, tell your doctor or health care professional that you are taking this medicine. Wear a medical ID bracelet or chain, and carry a card that describes your disease and details of your medicine and dosage times. This medicine may cause a decrease in folic acid and vitamin B12. You should make sure that you get enough vitamins while you are taking this medicine. Discuss the foods you eat and the vitamins you take with your health care professional. What side effects may I notice from receiving this medicine? Side effects that you should report to your doctor or health care professional as soon as possible:  allergic reactions like skin rash, itching or hives, swelling of the face, lips, or tongue  blood in the urine  breathing problems  feeling faint or lightheaded, falls  increased urination  muscle aches or pains  pain while urinating  signs and symptoms of low blood sugar such as feeling anxious, confusion, dizziness, increased hunger, unusually weak or tired, sweating, shakiness, cold, irritable, headache, blurred vision, fast heartbeat, loss of consciousness  slow or irregular heartbeat  unusual stomach pain or discomfort  unusually tired or weak Side effects that usually do not require medical attention (report to your doctor or health care professional if they continue or are  bothersome):  diarrhea  gas  headache  heartburn  metallic taste in mouth  nausea  sore throat  stuffy or runny nose  upset stomach This list may not describe all possible side effects. Call your doctor for medical advice about side effects. You may report side effects to FDA at 1-800-FDA-1088. Where should I keep my medicine? Keep out of the reach of children. Store at room temperature between 15 and 30 degrees C (59 and 86 degrees F). Protect from moisture and light. Throw away any unused medicine after the expiration date. NOTE: This sheet is a summary. It may not cover all possible information. If you have questions about this medicine, talk to your doctor, pharmacist, or health care provider.  2020 Elsevier/Gold Standard (2017-05-29 13:43:47)  

## 2019-10-30 NOTE — Assessment & Plan Note (Signed)
Acute, noted in clinic today.  Given no history of same, advised patient to check BP readings at home daily until next week's office visit.  If BP readings stay > 140/90, may consider antihypertensive treatment.

## 2019-10-30 NOTE — Progress Notes (Signed)
BP (!) 172/80 (BP Location: Left Arm, Cuff Size: Normal)   Pulse 65   Temp 98 F (36.7 C)   Ht 5' 9.49" (1.765 m)   Wt 234 lb 2 oz (106.2 kg)   SpO2 99%   BMI 34.09 kg/m    Subjective:    Patient ID: Jacob Owen, male    DOB: January 07, 1966, 54 y.o.   MRN: [id number]  HPI: Jacob BrineJoseph Babson is a 54 y.o. male presenting for hospital follow up.  Chief Complaint  Patient presents with  . Hospitalization Follow-up    Diabetes, elevated liver enzymes, heart murmur   HOSPITAL FOLLOW UP Time since discharge:  6 days Hospital/facility: [facility] Diagnosis: erythroderma psoriasis Procedures/tests: EGD - showed esophagitis - patient reports "small esophagus" Consultants: Dermatology  New medications: cyclosporine  Discharge instructions:  Follow up with Dermatologist Status: better  PSORIASIS Skin biopsy performed to right upper arm while in [facility].  Stitch to come out 7/5.  Reports he had a dermatologist appointment on Tuesday - she discharged him to [facility] in [place], however he does not have an appointment with them until August 12.  He is not taking the coseytnx anymore for fear of reaction with cyclosporine. Patient is willing to call [facility] and get on cancellation list to be seen sooner. Duration: months Location: worst in legs and on scalp now Painful: no Itching: yes; improved Onset: gradual Context: smaller Associated signs and symptoms: no fever, muscle/joint aches today History of skin cancer: no History of precancerous skin lesions: no Family history of skin cancer: no  HEART MURMUR Patient states while he was in the hospital, in the endoscopy suite, he was diagnosed with a heart murmur.  He stated they were pointing at a screen and stated he had a heart murmur.  He had never been told this in the past. He denies chest pain, palpitations, or shortness of breath today.  ELEVATED BLOOD PRESSURE Patient with concerns about elevated blood pressure  today in clinic.  No history of hypertension in the past and not on antihypertensives.  Patient does endorse moderate anxiety and worry today about his chronic illness and long-term outcome.  DIABETES Patient states he has not been having as good of an appetite and has cut down to only 2 pepsis per day.  He reports at the hospital, nobody told him he had diabetes. Hypoglycemic episodes:no Polydipsia/polyuria: no Visual disturbance: no Chest pain: no Paresthesias: no Glucose Monitoring: no Taking Insulin?: no Blood Pressure Monitoring: not checking Retinal Examination: Not up to Date Foot Exam: Not up to Date Diabetic Education: Not Completed Pneumovax: Not up to Date Influenza: deferred to flu season Aspirin: no  Allergies  Allergen Reactions  . Chocolate     Other reaction(s): ANAPHYLAXIS  . Codeine Nausea And Vomiting  . Other Rash    Bed bugs.   . Penicillins Itching   Outpatient Encounter Medications as of 10/30/2019  Medication Sig  . albuterol (VENTOLIN HFA) 108 (90 Base) MCG/ACT inhaler Inhale 2 puffs into the lungs every 6 (six) hours as needed.  . cetirizine (ZYRTEC) 10 MG tablet Take 1 tablet (10 mg total) by mouth daily.  . clobetasol ointment (TEMOVATE) 0.05 % Apply 1 application topically 2 (two) times daily.  . cycloSPORINE modified (NEORAL) 100 MG capsule Take by mouth.  . cycloSPORINE modified (NEORAL) 25 MG capsule Take by mouth.  . triamcinolone cream (KENALOG) 0.1 % Apply 1 application topically 2 (two) times daily.  Marland Kitchen. aspirin EC 81 MG  tablet Take 1 tablet (81 mg total) by mouth daily. Swallow whole. (Patient not taking: Reported on 10/30/2019)  . metFORMIN (GLUCOPHAGE) 500 MG tablet Take 1 tablet (500 mg total) by mouth 2 (two) times daily with a meal.  . Multiple Vitamins-Minerals (MULTIVITAMIN WITH MINERALS) tablet Take 1 tablet by mouth daily. (Patient not taking: Reported on 10/30/2019)  . omeprazole (PRILOSEC OTC) 20 MG tablet Take by mouth. (Patient not  taking: Reported on 10/30/2019)  . pravastatin (PRAVACHOL) 40 MG tablet Take 1 tablet (40 mg total) by mouth daily. (Patient not taking: Reported on 10/30/2019)  . TRIAMCINOLONE ACETONIDE, TOP, (TRIANEX) 0.05 % OINT Apply twice a day to affected areas psoriasis. Avoid face, groin, underarms. (Patient not taking: Reported on 10/30/2019)  . [DISCONTINUED] Coal Tar Extract (PSORIASIN EX) Apply 1 application topically daily as needed.   No facility-administered encounter medications on file as of 10/30/2019.   Patient Active Problem List   Diagnosis Date Noted  . Type 2 diabetes mellitus with hyperglycemia, without long-term current use of insulin (HCC) 10/30/2019  . Elevated blood pressure reading 10/30/2019  . Hyperlipidemia 04/19/2014  . Plaque psoriasis 04/19/2014  . Coronary artery disease involving native coronary artery of native heart without angina pectoris 04/17/2014  . Tobacco use disorder 04/17/2014   Past Medical History:  Diagnosis Date  . Alcoholism (HCC)   . Ankle fracture    Left  . Arm fracture, right   . Bradycardia   . Chronic venous stasis dermatitis of both lower extremities   . Collar bone fracture   . Concussion    x's 7. (Younger)  . COPD (chronic obstructive pulmonary disease) (HCC)   . Coronary artery disease involving native coronary artery of native heart without angina pectoris   . Hyperlipidemia   . Myocardial infarction (HCC)   . Obesity   . Plaque psoriasis   . Right wrist fracture   . Suspected respiratory disease   . Tailbone injury   . Tobacco use disorder    Relevant past medical, surgical, family and social history reviewed and updated as indicated. Interim medical history since our last visit reviewed.  Review of Systems  Constitutional: Negative.  Negative for activity change, appetite change, chills, fatigue and fever.  Eyes: Negative.  Negative for visual disturbance.  Respiratory: Negative.  Negative for cough, shortness of breath and  wheezing.   Cardiovascular: Negative.  Negative for chest pain, palpitations and leg swelling.  Musculoskeletal: Negative.  Negative for arthralgias, gait problem, joint swelling and myalgias.  Skin: Negative.  Negative for color change and rash.  Neurological: Negative.   Psychiatric/Behavioral: Negative.    Per HPI unless specifically indicated above     Objective:    BP (!) 172/80 (BP Location: Left Arm, Cuff Size: Normal)   Pulse 65   Temp 98 F (36.7 C)   Ht 5' 9.49" (1.765 m)   Wt 234 lb 2 oz (106.2 kg)   SpO2 99%   BMI 34.09 kg/m   Wt Readings from Last 3 Encounters:  10/30/19 234 lb 2 oz (106.2 kg)  10/20/19 232 lb 3.2 oz (105.3 kg)  09/26/19 243 lb (110.2 kg)    Physical Exam Vitals and nursing note reviewed.  Constitutional:      General: He is not in acute distress.    Appearance: Normal appearance. He is not toxic-appearing.  Eyes:     General: No scleral icterus.    Extraocular Movements: Extraocular movements intact.  Cardiovascular:     Rate and  Rhythm: Normal rate and regular rhythm.  Pulmonary:     Effort: Pulmonary effort is normal. No respiratory distress.     Breath sounds: No wheezing.  Abdominal:     General: Abdomen is flat. There is no distension.  Musculoskeletal:        General: Normal range of motion.     Right lower leg: Swelling (much improved from previously) present.     Left lower leg: Swelling (much improved from previous) present.  Skin:    General: Skin is warm and dry.     Coloration: Skin is not jaundiced or pale.  Neurological:     General: No focal deficit present.     Mental Status: He is alert and oriented to person, place, and time.     Motor: No weakness.     Gait: Gait normal.  Psychiatric:        Mood and Affect: Mood normal.        Behavior: Behavior normal.        Thought Content: Thought content normal.        Judgment: Judgment normal.     Results for orders placed or performed in visit on 10/20/19  TSH   Result Value Ref Range   TSH 3.080 0.450 - 4.500 uIU/mL  HgB A1c  Result Value Ref Range   Hgb A1c MFr Bld 7.7 (H) 4.8 - 5.6 %   Est. average glucose Bld gHb Est-mCnc 174 mg/dL  Comprehensive metabolic panel  Result Value Ref Range   Glucose 169 (H) 65 - 99 mg/dL   BUN 6 6 - 24 mg/dL   Creatinine, Ser 4.27 0.76 - 1.27 mg/dL   GFR calc non Af Amer 82 >59 mL/min/1.73   GFR calc Af Amer 95 >59 mL/min/1.73   BUN/Creatinine Ratio 6 (L) 9 - 20   Sodium 138 134 - 144 mmol/L   Potassium 4.1 3.5 - 5.2 mmol/L   Chloride 100 96 - 106 mmol/L   CO2 21 20 - 29 mmol/L   Calcium 8.8 8.7 - 10.2 mg/dL   Total Protein 6.7 6.0 - 8.5 g/dL   Albumin 3.7 (L) 3.8 - 4.9 g/dL   Globulin, Total 3.0 1.5 - 4.5 g/dL   Albumin/Globulin Ratio 1.2 1.2 - 2.2   Bilirubin Total 0.7 0.0 - 1.2 mg/dL   Alkaline Phosphatase 157 (H) 48 - 121 IU/L   AST 49 (H) 0 - 40 IU/L   ALT 75 (H) 0 - 44 IU/L  CBC with Differential/Platelet  Result Value Ref Range   WBC 8.7 3.4 - 10.8 x10E3/uL   RBC 4.49 4.14 - 5.80 x10E6/uL   Hemoglobin 14.1 13.0 - 17.7 g/dL   Hematocrit 06.2 37.6 - 51.0 %   MCV 95 79 - 97 fL   MCH 31.4 26.6 - 33.0 pg   MCHC 33.1 31 - 35 g/dL   RDW 28.3 15.1 - 76.1 %   Platelets 313 150 - 450 x10E3/uL   Neutrophils 65 Not Estab. %   Lymphs 20 Not Estab. %   Monocytes 9 Not Estab. %   Eos 5 Not Estab. %   Basos 1 Not Estab. %   Neutrophils Absolute 5.8 1 - 7 x10E3/uL   Lymphocytes Absolute 1.7 0 - 3 x10E3/uL   Monocytes Absolute 0.7 0 - 0 x10E3/uL   EOS (ABSOLUTE) 0.4 0.0 - 0.4 x10E3/uL   Basophils Absolute 0.1 0 - 0 x10E3/uL   Immature Granulocytes 0 Not Estab. %   Immature Grans (Abs)  0.0 0.0 - 0.1 x10E3/uL  B Nat Peptide  Result Value Ref Range   BNP 66.6 0.0 - 100.0 pg/mL      Assessment & Plan:   Problem List Items Addressed This Visit      Cardiovascular and Mediastinum   Coronary artery disease involving native coronary artery of native heart without angina pectoris    Chronic,  ongoing.  EKG today showed frequent PACs - this is likely what was seen on the screen in the endoscopy suite.  Will generate referral to Cardiology today with [facility] charity care for both this and ongoing monitoring/recommendations for previous myocardial infarction.      Relevant Orders   EKG 12-Lead (Completed)   Ambulatory referral to Cardiology     Endocrine   Type 2 diabetes mellitus with hyperglycemia, without long-term current use of insulin (HCC) - Primary    Chronic, ongoing.  HgbA1c 7.7% at last visit.  Agreeable to start metformin 500 mg bid today.  Advised to call/return to clinic with any side effects.  Discussed continuing to decrease sugar and simple carbohydrates.  Will recheck A1c in September.      Relevant Medications   metFORMIN (GLUCOPHAGE) 500 MG tablet     Musculoskeletal and Integument   Plaque psoriasis    Chronic, improving.  Currently only taking cyclosporine.  Strongly encouraged patient to establish with [facility] in [place] prior to middle of August for ongoing extensive lab monitoring that cyclosporine requires.  Will  check maintenance labs until then.  Follow up for labs in 1 week.  Will remove stitch at biopsy site next week.        Other   Elevated blood pressure reading    Acute, noted in clinic today.  Given no history of same, advised patient to check BP readings at home daily until next week's office visit.  If BP readings stay > 140/90, may consider antihypertensive treatment.          Follow up plan: Return in about 1 week (around 11/06/2019) for diabetes, psoriasis follow up.

## 2019-10-30 NOTE — Assessment & Plan Note (Signed)
Chronic, ongoing.  HgbA1c 7.7% at last visit.  Agreeable to start metformin 500 mg bid today.  Advised to call/return to clinic with any side effects.  Discussed continuing to decrease sugar and simple carbohydrates.  Will recheck A1c in September.

## 2019-10-30 NOTE — Assessment & Plan Note (Addendum)
Chronic, improving.  Currently only taking cyclosporine.  Strongly encouraged patient to establish with [facility] in [place] prior to middle of August for ongoing extensive lab monitoring that cyclosporine requires.  Will  check maintenance labs until then.  Follow up for labs in 1 week.  Will remove stitch at biopsy site next week.

## 2019-11-04 ENCOUNTER — Ambulatory Visit: Payer: Self-pay | Admitting: Pharmacist

## 2019-11-04 ENCOUNTER — Telehealth: Payer: Self-pay

## 2019-11-04 NOTE — Chronic Care Management (AMB) (Signed)
  Chronic Care Management   Note  11/04/2019 Name: Jacob Owen MRN: [id number] DOB: [birth year]  [name] is a 54 y.o. year old male who is a primary care patient of Mardene Celeste I, NP. The CCM team was consulted for assistance with chronic disease management and care coordination needs.    Attempted to contact patient for medication management review. Left HIPAA compliant message for patient to return my call at their convenience.   Plan: - Will outreach again later this week  Catie Feliz Beam, PharmD, [facility] Clinical Pharmacist [facility] [phone]

## 2019-11-05 ENCOUNTER — Telehealth: Payer: Self-pay

## 2019-11-06 ENCOUNTER — Encounter: Payer: Self-pay | Admitting: Nurse Practitioner

## 2019-11-06 ENCOUNTER — Ambulatory Visit (INDEPENDENT_AMBULATORY_CARE_PROVIDER_SITE_OTHER): Payer: Self-pay | Admitting: Nurse Practitioner

## 2019-11-06 ENCOUNTER — Other Ambulatory Visit: Payer: Self-pay

## 2019-11-06 VITALS — BP 133/76 | HR 69 | Temp 98.2°F | Ht 71.0 in | Wt 234.0 lb

## 2019-11-06 DIAGNOSIS — R1310 Dysphagia, unspecified: Secondary | ICD-10-CM

## 2019-11-06 DIAGNOSIS — R030 Elevated blood-pressure reading, without diagnosis of hypertension: Secondary | ICD-10-CM

## 2019-11-06 DIAGNOSIS — E782 Mixed hyperlipidemia: Secondary | ICD-10-CM

## 2019-11-06 DIAGNOSIS — D529 Folate deficiency anemia, unspecified: Secondary | ICD-10-CM | POA: Insufficient documentation

## 2019-11-06 DIAGNOSIS — E1165 Type 2 diabetes mellitus with hyperglycemia: Secondary | ICD-10-CM

## 2019-11-06 DIAGNOSIS — L400 Psoriasis vulgaris: Secondary | ICD-10-CM

## 2019-11-06 DIAGNOSIS — Z79899 Other long term (current) drug therapy: Secondary | ICD-10-CM

## 2019-11-06 DIAGNOSIS — G44209 Tension-type headache, unspecified, not intractable: Secondary | ICD-10-CM

## 2019-11-06 DIAGNOSIS — R1319 Other dysphagia: Secondary | ICD-10-CM

## 2019-11-06 DIAGNOSIS — F17200 Nicotine dependence, unspecified, uncomplicated: Secondary | ICD-10-CM

## 2019-11-06 NOTE — Assessment & Plan Note (Signed)
Chronic, ongoing.  Again encouraged use of metformin Rx that was sent to Pharmacy.  Congratulated for eliminating pepsi from diet completely.  Continue diabetic diet.  Will recheck A1c in September.  Needs urine microalbumin at next visit.

## 2019-11-06 NOTE — Progress Notes (Signed)
BP 133/76 (BP Location: Left Arm, Patient Position: Sitting, Cuff Size: Normal)   Pulse 69   Temp 98.2 F (36.8 C) (Oral)   Ht 5\' 11"  (1.803 m)   Wt 234 lb (106.1 kg)   SpO2 97%   BMI 32.64 kg/m    Subjective:    Patient ID: Jacob Owen, male    DOB: [birth year], 54 y.o.   MRN: [id number]  HPI: Fuquan Wilson is a 54 y.o. male presenting for follow up  Chief Complaint  Patient presents with  . Psoriasis  . Diabetes   PSORIASIS Was able to get into Dermatology at [facility] earlier than middle of August - saw [facility] Dermatologist 11/04/19 and reports the skin issue is doing much better.  He is slightly worried about BP - was 177/98 in Derm office.  He has not taken the Cosentyx yet and is worried about giving himself shots and is also worried about the injection due to the injection freezing at his house.  He reports his new Dermatologist told him it was okay to take with the cyclosporine.  He does not know how long he will stay on the cyclosporine.  Next appointment with Dermatology is middle of August. Duration: years Location:  Painful: no Itching: no Onset: gradual Context: smaller Associated signs and symptoms: none History of skin cancer: no History of precancerous skin lesions: no Family history of skin cancer: no  DIABETES He has not started his metformin.  Reports not drinking any pepsi in the last 2 days.  He only drinks Aquafina water or aquafina with Crystal Light.  Has been working on cutting down on Pepsi since he was diagnosed with diabetes.  He does report a mild headache that he has had for the past week or so. Hypoglycemic episodes:no Polydipsia/polyuria: no Visual disturbance: no Chest pain: no Paresthesias: no Glucose Monitoring: no Taking Insulin?: no Blood Pressure Monitoring: not checking Retinal Examination: Not up to Date Foot Exam: Not up to Date Diabetic Education: Not Completed Pneumovax: Not up to Date Influenza: Not up to Date Aspirin: advised  to start, patient reports he is going to today  MIGRAINES Duration: days Onset: sudden Severity: mild Quality: ache Frequency: constant Location: front of head and sides - across forehead Headache duration: ongoing Radiation: no Time of day headache occurs:  Alleviating factors: does not notice when sleeping Aggravating factors: being awake Headache status at time of visit: current headache Treatments attempted: Treatments attempted: none    Aura: no Nausea:  no Vomiting: no Photophobia:  no Phonophobia:  no Effect on social functioning:  no Numbers of missed days of school/work each month:  Confusion:  no Gait disturbance/ataxia:  no Behavioral changes:  no Fevers:  no  HYPERLIPIDEMIA Cholesterol levels were elevated at Dermatology office visit earlier this week - patient reports he was not fasting for these labs.  LDL was 173. Hyperlipidemia status: not currently on treatment Satisfied with current treatment?  n/a Side effects:  n/a Medication compliance: n/a Past cholesterol meds: pravastatin - reports intolerant to this Supplements: none Aspirin:  no The 10-year ASCVD risk score September DC Jr., et al., 2013) is: 32.6%   Values used to calculate the score:     Age: 63 years     Sex: Male     Is Non-Hispanic African American: No     Diabetic: Yes     Tobacco smoker: Yes     Systolic Blood Pressure: 133 mmHg     Is BP treated: No  HDL Cholesterol: 40 mg/dL     Total Cholesterol: 261 mg/dL Chest pain:  no Coronary artery disease:  yes Family history CAD:  no Family history early CAD:  no  GERD Has started Prilosec and has not noticed much change in his symptoms. GERD control status: controlled  Satisfied with current treatment? yes Heartburn frequency: rarely Medication side effects: no  Medication compliance: excellent Previous GERD medications: none Antacid use frequency:  none Duration: none Nature: none Location: none Heartburn duration: none   Alleviatiating factors:  Aggravating factors:  Laying down after eating, eating large meals Dysphagia: no Odynophagia:  no Hematemesis: no Blood in stool: no EGD: yes; [facility] hospital at end of June - showed esophagitis  Allergies  Allergen Reactions  . Chocolate     Other reaction(s): ANAPHYLAXIS  . Codeine Nausea And Vomiting  . Other Rash    Bed bugs.   . Penicillins Itching   Outpatient Encounter Medications as of 11/06/2019  Medication Sig  . albuterol (VENTOLIN HFA) 108 (90 Base) MCG/ACT inhaler Inhale 2 puffs into the lungs every 6 (six) hours as needed.  . cetirizine (ZYRTEC) 10 MG tablet Take 1 tablet (10 mg total) by mouth daily.  . clobetasol ointment (TEMOVATE) 0.05 % Apply 1 application topically 2 (two) times daily.  . cycloSPORINE modified (NEORAL) 100 MG capsule Take by mouth.  . cycloSPORINE modified (NEORAL) 25 MG capsule Take by mouth.  Marland Kitchen ketoconazole (NIZORAL) 2 % shampoo Alternate daily with head and shoulders. Let sit on scalp for 5 minutes before rinsing  . Multiple Vitamins-Minerals (MULTIVITAMIN WITH MINERALS) tablet Take 1 tablet by mouth daily.   Marland Kitchen omeprazole (PRILOSEC OTC) 20 MG tablet Take by mouth.   . Secukinumab (COSENTYX SENSOREADY PEN) 150 MG/ML SOAJ Inject into the skin.  Marland Kitchen triamcinolone cream (KENALOG) 0.1 % Apply 1 application topically 2 (two) times daily.  Marland Kitchen aspirin EC 81 MG tablet Take 1 tablet (81 mg total) by mouth daily. Swallow whole. (Patient not taking: Reported on 10/30/2019)  . metFORMIN (GLUCOPHAGE) 500 MG tablet Take 1 tablet (500 mg total) by mouth 2 (two) times daily with a meal. (Patient not taking: Reported on 11/06/2019)  . [DISCONTINUED] pravastatin (PRAVACHOL) 40 MG tablet Take 1 tablet (40 mg total) by mouth daily. (Patient not taking: Reported on 10/30/2019)  . [DISCONTINUED] TRIAMCINOLONE ACETONIDE, TOP, (TRIANEX) 0.05 % OINT Apply twice a day to affected areas psoriasis. Avoid face, groin, underarms. (Patient not taking: Reported on  10/30/2019)   No facility-administered encounter medications on file as of 11/06/2019.   Patient Active Problem List   Diagnosis Date Noted  . Anemia due to folic acid deficiency 11/06/2019  . Esophageal dysphagia 11/06/2019  . Type 2 diabetes mellitus without complication, without long-term current use of insulin (HCC) 10/30/2019  . Elevated blood pressure reading 10/30/2019  . COPD (chronic obstructive pulmonary disease) (HCC) 10/21/2019  . Hyperlipidemia 04/19/2014  . Plaque psoriasis 04/19/2014  . Coronary artery disease involving native coronary artery of native heart without angina pectoris 04/17/2014  . Tobacco use disorder 04/17/2014   Past Medical History:  Diagnosis Date  . Alcoholism (HCC)   . Ankle fracture    Left  . Arm fracture, right   . Bradycardia   . Chronic venous stasis dermatitis of both lower extremities   . Collar bone fracture   . Concussion    x's 7. (Younger)  . COPD (chronic obstructive pulmonary disease) (HCC)   . Coronary artery disease involving native coronary artery of native  heart without angina pectoris   . Hyperlipidemia   . Myocardial infarction (HCC)   . Obesity   . Plaque psoriasis   . Right wrist fracture   . Suspected respiratory disease   . Tailbone injury   . Tobacco use disorder    Relevant past medical, surgical, family and social history reviewed and updated as indicated. Interim medical history since our last visit reviewed. Allergies and medications reviewed and updated.  Review of Systems  Constitutional: Negative.  Negative for activity change, appetite change, chills, fatigue and fever.  Eyes: Negative.  Negative for visual disturbance.  Respiratory: Positive for shortness of breath (occasionally during middle of night). Negative for cough, chest tightness and wheezing.   Cardiovascular: Negative.  Negative for chest pain, palpitations and leg swelling.  Gastrointestinal: Negative.  Negative for blood in stool, constipation,  nausea, rectal pain and vomiting.  Endocrine: Negative.   Musculoskeletal: Negative.   Skin: Positive for rash. Negative for color change and wound.  Neurological: Positive for headaches. Negative for dizziness, weakness and numbness.  Psychiatric/Behavioral: Negative.  Negative for agitation and confusion. The patient is not nervous/anxious.     Per HPI unless specifically indicated above     Objective:    BP 133/76 (BP Location: Left Arm, Patient Position: Sitting, Cuff Size: Normal)   Pulse 69   Temp 98.2 F (36.8 C) (Oral)   Ht 5\' 11"  (1.803 m)   Wt 234 lb (106.1 kg)   SpO2 97%   BMI 32.64 kg/m   Wt Readings from Last 3 Encounters:  11/06/19 234 lb (106.1 kg)  10/30/19 234 lb 2 oz (106.2 kg)  10/20/19 232 lb 3.2 oz (105.3 kg)    Physical Exam Vitals and nursing note reviewed.  Constitutional:      General: He is not in acute distress.    Appearance: Normal appearance. He is not toxic-appearing.  HENT:     Head: Normocephalic and atraumatic.     Nose: Nose normal. No congestion.     Mouth/Throat:     Mouth: Mucous membranes are moist.     Pharynx: Oropharynx is clear. No oropharyngeal exudate.  Eyes:     General: No scleral icterus.    Extraocular Movements: Extraocular movements intact.     Pupils: Pupils are equal, round, and reactive to light.  Cardiovascular:     Rate and Rhythm: Normal rate.     Heart sounds: Normal heart sounds. No murmur heard.   Pulmonary:     Effort: Pulmonary effort is normal. No respiratory distress.     Breath sounds: Wheezing present. No rhonchi.  Abdominal:     General: Abdomen is flat. Bowel sounds are normal.     Palpations: Abdomen is soft.  Musculoskeletal:        General: Normal range of motion.     Right lower leg: No edema.     Left lower leg: No edema.  Skin:    General: Skin is warm and dry.     Capillary Refill: Capillary refill takes less than 2 seconds.     Coloration: Skin is not jaundiced or pale.      Findings: Rash (psoriasis improving) present.  Neurological:     General: No focal deficit present.     Mental Status: He is alert and oriented to person, place, and time.     Motor: No weakness.     Gait: Gait normal.  Psychiatric:        Mood and Affect: Mood normal.  Behavior: Behavior normal.        Thought Content: Thought content normal.        Judgment: Judgment normal.        Assessment & Plan:   Problem List Items Addressed This Visit      Digestive   Esophageal dysphagia    Chronic, ongoing.  On most recent EGD June, 2021.  Continue daily omeprazole.        Endocrine   Type 2 diabetes mellitus without complication, without long-term current use of insulin (HCC) - Primary    Chronic, ongoing.  Again encouraged use of metformin Rx that was sent to Pharmacy.  Congratulated for eliminating pepsi from diet completely.  Continue diabetic diet.  Will recheck A1c in September.  Needs urine microalbumin at next visit.        Musculoskeletal and Integument   Plaque psoriasis    Chronic, ongoing.  Continue to follow closely and follow recommendations of [facility] for long-term monitoring of cyclosporine and cosentyx.          Other   Hyperlipidemia    Acute, ongoing.  Lipids checked in [facility] hospital near end of June showed LDL of 72.  Lipids checked at [facility] Dermatology (nonfasting) showed LDL 173.  Concerned for ASCVD risk given history of MI and current score.  Previously reportedly intolerant pravastatin, willing to try other another statin however contraindicated with psoriasis therapy.  Alternatives like Zetia may cause increased serum levels of cyclosporine.  Referral to Cardiology pending, in meantime start aspirin.  Discussed strict ED precautions for chest pain symptoms.       Tobacco use disorder    Chronic, ongoing.  Smoking ~1 ppd and not currently interested in cessation.  Educated on risks of continuing smoking.  Will be available to patient if  desiring cessation from tobacco in future.      Elevated blood pressure reading    BP elevated at Dermatology visit Tuesday, normal when checked today in clinic.  Will need to follow this closely; advised to monitor BP at home and return to clinic if BP >140/90.  Can consider antihypertensive that does not interact with cyclosporine in future if needed.       Other Visit Diagnoses    Acute non intractable tension-type headache       Long term current use of cyclosporine           Follow up plan: Return in about 4 weeks (around 12/04/2019) for diabetes f/u.

## 2019-11-06 NOTE — Assessment & Plan Note (Addendum)
Acute, ongoing.  Lipids checked in [facility] hospital near end of June showed LDL of 72.  Lipids checked at [facility] Dermatology (nonfasting) showed LDL 173.  Concerned for ASCVD risk given history of MI and current score.  Previously reportedly intolerant pravastatin, willing to try other another statin however contraindicated with psoriasis therapy.  Alternatives like Zetia may cause increased serum levels of cyclosporine.  Referral to Cardiology pending, in meantime start aspirin.  Discussed strict ED precautions for chest pain symptoms.

## 2019-11-06 NOTE — Assessment & Plan Note (Signed)
Chronic, ongoing.  On most recent EGD June, 2021.  Continue daily omeprazole.

## 2019-11-06 NOTE — Assessment & Plan Note (Signed)
BP elevated at Dermatology visit Tuesday, normal when checked today in clinic.  Will need to follow this closely; advised to monitor BP at home and return to clinic if BP >140/90.  Can consider antihypertensive that does not interact with cyclosporine in future if needed.

## 2019-11-06 NOTE — Assessment & Plan Note (Signed)
Chronic, ongoing.  Smoking ~1 ppd and not currently interested in cessation.  Educated on risks of continuing smoking.  Will be available to patient if desiring cessation from tobacco in future.

## 2019-11-06 NOTE — Assessment & Plan Note (Signed)
Chronic, ongoing.  Continue to follow closely and follow recommendations of [facility] Dermatology for long-term monitoring of cyclosporine and cosentyx.

## 2019-11-09 ENCOUNTER — Encounter: Payer: Self-pay | Admitting: Dermatology

## 2019-11-12 ENCOUNTER — Ambulatory Visit: Payer: Self-pay | Admitting: Dermatology

## 2019-11-12 ENCOUNTER — Encounter: Payer: Self-pay | Admitting: Nurse Practitioner

## 2019-11-24 ENCOUNTER — Ambulatory Visit (INDEPENDENT_AMBULATORY_CARE_PROVIDER_SITE_OTHER): Payer: Self-pay | Admitting: Nurse Practitioner

## 2019-11-24 ENCOUNTER — Encounter: Payer: Self-pay | Admitting: Nurse Practitioner

## 2019-11-24 ENCOUNTER — Other Ambulatory Visit: Payer: Self-pay

## 2019-11-24 VITALS — BP 164/93 | HR 74 | Temp 98.1°F | Wt 234.0 lb

## 2019-11-24 DIAGNOSIS — E1159 Type 2 diabetes mellitus with other circulatory complications: Secondary | ICD-10-CM

## 2019-11-24 DIAGNOSIS — I10 Essential (primary) hypertension: Secondary | ICD-10-CM

## 2019-11-24 DIAGNOSIS — E1165 Type 2 diabetes mellitus with hyperglycemia: Secondary | ICD-10-CM

## 2019-11-24 DIAGNOSIS — L400 Psoriasis vulgaris: Secondary | ICD-10-CM

## 2019-11-24 MED ORDER — LISINOPRIL 10 MG PO TABS
10.0000 mg | ORAL_TABLET | Freq: Every day | ORAL | 0 refills | Status: DC
Start: 2019-11-24 — End: 2019-12-25

## 2019-11-24 NOTE — Assessment & Plan Note (Addendum)
Chronic, ongoing.  Headaches likely secondary to ongoing elevated blood pressure.  Will start lisinopril today for elevated BP and to protect kidneys with diabetes.  Appears not to interact with cyclosporine.  Also continue metformin 500 mg bid for diabetes.  Due for HbA1c recheck in September.  Will also check urine microalbumin at that time.  Referral to Podiatry with [facility] generated today for diabetic foot and toenail management.  Continue diabetic diet.  Follow up in 4 weeks to measure benefit of lisinopril and check BMP.

## 2019-11-24 NOTE — Patient Instructions (Addendum)
Nice seeing you today, Jacob Owen!    We will call you about the referral to the Podiatrist.    Go ahead and start the lisinopril 10 mg for your blood pressure.    Lisinopril Tablets What is this medicine? LISINOPRIL (lyse IN oh pril) is an ACE inhibitor. It treats high blood pressure and heart failure. It can treat heart damage after a heart attack. This medicine may be used for other purposes; ask your health care provider or pharmacist if you have questions. COMMON BRAND NAME(S): Prinivil, Zestril What should I tell my health care provider before I take this medicine? They need to know if you have any of these conditions:  diabetes  heart or blood vessel disease  kidney disease  low blood pressure  previous swelling of the tongue, face, or lips with difficulty breathing, difficulty swallowing, hoarseness, or tightening of the throat  an unusual or allergic reaction to lisinopril, other ACE inhibitors, insect venom, foods, dyes, or preservatives  pregnant or trying to get pregnant  breast-feeding How should I use this medicine? Take this drug by mouth. Take it as directed on the prescription label at the same time every day. You can take it with or without food. If it upsets your stomach, take it with food. Keep taking it unless your health care provider tells you to stop. Talk to your health care provider about the use of this drug in children. While it may be prescribed for children as young as 6 for selected conditions, precautions do apply. Overdosage: If you think you have taken too much of this medicine contact a poison control center or emergency room at once. NOTE: This medicine is only for you. Do not share this medicine with others. What if I miss a dose? If you miss a dose, take it as soon as you can. If it is almost time for your next dose, take only that dose. Do not take double or extra doses. What may interact with this medicine? Do not take this medicine with any of  the following medications:  hymenoptera venom  sacubitril; valsartan This medicines may also interact with the following medications:  aliskiren  angiotensin receptor blockers, like losartan or valsartan  certain medicines for diabetes  diuretics  everolimus  gold compounds  lithium  NSAIDs, medicines for pain and inflammation, like ibuprofen or naproxen  potassium salts or supplements  salt substitutes  sirolimus  temsirolimus This list may not describe all possible interactions. Give your health care provider a list of all the medicines, herbs, non-prescription drugs, or dietary supplements you use. Also tell them if you smoke, drink alcohol, or use illegal drugs. Some items may interact with your medicine. What should I watch for while using this medicine? Visit your doctor or health care professional for regular check ups. Check your blood pressure as directed. Ask your doctor what your blood pressure should be, and when you should contact him or her. Do not treat yourself for coughs, colds, or pain while you are using this medicine without asking your doctor or health care professional for advice. Some ingredients may increase your blood pressure. Women should inform their doctor if they wish to become pregnant or think they might be pregnant. There is a potential for serious side effects to an unborn child. Talk to your health care professional or pharmacist for more information. Check with your doctor or health care professional if you get an attack of severe diarrhea, nausea and vomiting, or if you sweat  a lot. The loss of too much body fluid can make it dangerous for you to take this medicine. You may get drowsy or dizzy. Do not drive, use machinery, or do anything that needs mental alertness until you know how this drug affects you. Do not stand or sit up quickly, especially if you are an older patient. This reduces the risk of dizzy or fainting spells. Alcohol can make  you more drowsy and dizzy. Avoid alcoholic drinks. Avoid salt substitutes unless you are told otherwise by your doctor or health care professional. What side effects may I notice from receiving this medicine? Side effects that you should report to your doctor or health care professional as soon as possible:  allergic reactions like skin rash, itching or hives, swelling of the hands, feet, face, lips, throat, or tongue  breathing problems  signs and symptoms of kidney injury like trouble passing urine or change in the amount of urine  signs and symptoms of increased potassium like muscle weakness; chest pain; or fast, irregular heartbeat  signs and symptoms of liver injury like dark yellow or brown urine; general ill feeling or flu-like symptoms; light-colored stools; loss of appetite; nausea; right upper belly pain; unusually weak or tired; yellowing of the eyes or skin  signs and symptoms of low blood pressure like dizziness; feeling faint or lightheaded, falls; unusually weak or tired  stomach pain with or without nausea and vomiting Side effects that usually do not require medical attention (report to your doctor or health care professional if they continue or are bothersome):  changes in taste  cough  dizziness  fever  headache  sensitivity to light This list may not describe all possible side effects. Call your doctor for medical advice about side effects. You may report side effects to FDA at 1-800-FDA-1088. Where should I keep my medicine? Keep out of the reach of children and pets. Store at room temperature between 20 and 25 degrees C (68 and 77 degrees F). Protect from moisture. Keep the container tightly closed. Do not freeze. Avoid exposure to extreme heat. Throw away any unused drug after the expiration date. NOTE: This sheet is a summary. It may not cover all possible information. If you have questions about this medicine, talk to your doctor, pharmacist, or health  care provider.  2020 Elsevier/Gold Standard (2018-11-20 11:38:35)

## 2019-11-24 NOTE — Progress Notes (Signed)
BP (!) 164/93 (BP Location: Left Arm, Cuff Size: Normal)   Pulse 74   Temp 98.1 F (36.7 C) (Oral)   Wt (!) 234 lb (106.1 kg)   SpO2 96%   BMI 32.64 kg/m    Subjective:    Patient ID: Jacob Owen, male    DOB: [birth year], 54 y.o.   MRN: [id number]  HPI: Jacob BrineJoseph Fisher is a 54 y.o. male presenting for follow up.  Chief Complaint  Patient presents with  . Follow-up   HYPERTENSION Patient has been having elevated blood pressures at multiple recent office visits.  Has not taken anything for blood pressure recently.  Reports years ago when he had his heart attack, he was given a medication to lower his heart rate and did not react well to it. Hypertension status: uncontrolled  Satisfied with current treatment? no Duration of hypertension: chronic BP monitoring frequency:  not checking BP medication side effects:  n/a Medication compliance: not currently on treatment Previous BP meds: none Aspirin: yes Recurrent headaches: yes Visual changes: no Palpitations: no Dyspnea: yes Chest pain: no Lower extremity edema: no Dizzy/lightheaded: yes at times with movement  DIABETES Patient reports taking the metformin twice daily and no significant side effects.  Seems to eat a diet high in protein although he is still eating fast food for every meal due to living conditions.  He continues to have a limited appetite and feels like he is sick if he eats too much.   Reports recently, he has noticed that his nails are very thick and hard to cut and also that they have a purple staining on his finger tips. Hypoglycemic episodes:no Polydipsia/polyuria: no Visual disturbance: no Chest pain: no Paresthesias: no Glucose Monitoring: no Taking Insulin?: none Blood Pressure Monitoring: not checking Retinal Examination: Not up to Date Foot Exam: Not up to Date Diabetic Education: Completed Pneumovax: Not up to Date Influenza: Not up to Date Aspirin: no  Reports concern about balance  issues and a tremor in his hands.  Has not had any falls and does not report significant issues with balance, only at certain times.  He does report a gait change when he is getting out of his car and also at night when he cannot see well.     Allergies  Allergen Reactions  . Chocolate     Other reaction(s): ANAPHYLAXIS  . Codeine Nausea And Vomiting  . Other Rash    Bed bugs.   . Penicillins Itching   Outpatient Encounter Medications as of 11/24/2019  Medication Sig  . albuterol (VENTOLIN HFA) 108 (90 Base) MCG/ACT inhaler Inhale 2 puffs into the lungs every 6 (six) hours as needed.  Marland Kitchen. aspirin EC 81 MG tablet Take 1 tablet (81 mg total) by mouth daily. Swallow whole.  . cetirizine (ZYRTEC) 10 MG tablet Take 1 tablet (10 mg total) by mouth daily.  . clobetasol ointment (TEMOVATE) 0.05 % Apply 1 application topically 2 (two) times daily.  . cycloSPORINE (SANDIMMUNE) 100 MG capsule Take 200 mg by mouth 2 (two) times daily.  . cycloSPORINE (SANDIMMUNE) 25 MG capsule Take 75 mg by mouth 2 (two) times daily.  Marland Kitchen. desonide (DESOWEN) 0.05 % ointment Apply topically.  Marland Kitchen. ibuprofen (ADVIL) 200 MG tablet Take 200 mg by mouth every 6 (six) hours as needed.  Marland Kitchen. ketoconazole (NIZORAL) 2 % shampoo Alternate daily with head and shoulders. Let sit on scalp for 5 minutes before rinsing  . metFORMIN (GLUCOPHAGE) 500 MG tablet Take 1 tablet (500 mg  total) by mouth 2 (two) times daily with a meal.  . Multiple Vitamins-Minerals (MULTIVITAMIN WITH MINERALS) tablet Take 1 tablet by mouth daily.   Marland Kitchen omeprazole (PRILOSEC) 20 MG capsule Take 20 mg by mouth daily.  Marland Kitchen triamcinolone cream (KENALOG) 0.1 % Apply 1 application topically 2 (two) times daily.  Marland Kitchen lisinopril (ZESTRIL) 10 MG tablet Take 1 tablet (10 mg total) by mouth daily.  . Secukinumab (COSENTYX SENSOREADY PEN) 150 MG/ML SOAJ Inject into the skin. (Patient not taking: Reported on 11/24/2019)  . [DISCONTINUED] omeprazole (PRILOSEC OTC) 20 MG tablet Take by  mouth.    No facility-administered encounter medications on file as of 11/24/2019.   Patient Active Problem List   Diagnosis Date Noted  . Anemia due to folic acid deficiency 11/06/2019  . Esophageal dysphagia 11/06/2019  . Type 2 diabetes mellitus with hyperglycemia, without long-term current use of insulin (HCC) 10/30/2019  . Hypertension associated with diabetes (HCC) 10/30/2019  . COPD (chronic obstructive pulmonary disease) (HCC) 10/21/2019  . Hyperlipidemia 04/19/2014  . Plaque psoriasis 04/19/2014  . Coronary artery disease involving native coronary artery of native heart without angina pectoris 04/17/2014  . Tobacco use disorder 04/17/2014   Past Medical History:  Diagnosis Date  . Alcoholism (HCC)   . Ankle fracture    Left  . Arm fracture, right   . Bradycardia   . Chronic venous stasis dermatitis of both lower extremities   . Collar bone fracture   . Concussion    x's 7. (Younger)  . COPD (chronic obstructive pulmonary disease) (HCC)   . Coronary artery disease involving native coronary artery of native heart without angina pectoris   . Hyperlipidemia   . Myocardial infarction (HCC)   . Obesity   . Plaque psoriasis   . Right wrist fracture   . Suspected respiratory disease   . Tailbone injury   . Tobacco use disorder    Relevant past medical, surgical, family and social history reviewed and updated as indicated. Interim medical history since our last visit reviewed.  Review of Systems  Constitutional: Negative.  Negative for activity change, appetite change, fatigue and fever.  Eyes: Negative.  Negative for visual disturbance.  Respiratory: Positive for shortness of breath (at times, none currently).   Cardiovascular: Negative.  Negative for chest pain and palpitations.  Gastrointestinal: Negative.   Skin: Negative.   Neurological: Positive for dizziness (at times with movement, none currently) and headaches. Negative for weakness, light-headedness and  numbness.  Psychiatric/Behavioral: Negative.  Negative for confusion and sleep disturbance. The patient is not nervous/anxious.     Per HPI unless specifically indicated above     Objective:    BP (!) 164/93 (BP Location: Left Arm, Cuff Size: Normal)   Pulse 74   Temp 98.1 F (36.7 C) (Oral)   Wt (!) 234 lb (106.1 kg)   SpO2 96%   BMI 32.64 kg/m   Wt Readings from Last 3 Encounters:  11/24/19 (!) 234 lb (106.1 kg)  11/06/19 234 lb (106.1 kg)  10/30/19 234 lb 2 oz (106.2 kg)    Physical Exam Vitals and nursing note reviewed.  Constitutional:      General: He is not in acute distress.    Appearance: Normal appearance. He is obese.  Eyes:     General: No scleral icterus.    Extraocular Movements: Extraocular movements intact.  Neck:     Vascular: No carotid bruit.  Cardiovascular:     Rate and Rhythm: Normal rate.  Heart sounds: Normal heart sounds. No murmur heard.   Pulmonary:     Effort: Pulmonary effort is normal. No respiratory distress.     Breath sounds: Normal breath sounds. No wheezing or rhonchi.  Abdominal:     General: Abdomen is flat. Bowel sounds are normal. There is no distension.  Musculoskeletal:        General: Normal range of motion.     Cervical back: Normal range of motion.     Right lower leg: No edema.     Left lower leg: No edema.  Skin:    General: Skin is warm and dry.     Coloration: Skin is not jaundiced or pale.  Neurological:     General: No focal deficit present.     Mental Status: He is alert and oriented to person, place, and time.     Motor: No weakness.     Gait: Gait normal.  Psychiatric:        Mood and Affect: Mood normal.        Behavior: Behavior normal.        Thought Content: Thought content normal.        Judgment: Judgment normal.     Results for orders placed or performed in visit on 10/20/19  TSH  Result Value Ref Range   TSH 3.080 0.450 - 4.500 uIU/mL  HgB A1c  Result Value Ref Range   Hgb A1c MFr Bld  7.7 (H) 4.8 - 5.6 %   Est. average glucose Bld gHb Est-mCnc 174 mg/dL  Comprehensive metabolic panel  Result Value Ref Range   Glucose 169 (H) 65 - 99 mg/dL   BUN 6 6 - 24 mg/dL   Creatinine, Ser 2.70 0.76 - 1.27 mg/dL   GFR calc non Af Amer 82 >59 mL/min/1.73   GFR calc Af Amer 95 >59 mL/min/1.73   BUN/Creatinine Ratio 6 (L) 9 - 20   Sodium 138 134 - 144 mmol/L   Potassium 4.1 3.5 - 5.2 mmol/L   Chloride 100 96 - 106 mmol/L   CO2 21 20 - 29 mmol/L   Calcium 8.8 8.7 - 10.2 mg/dL   Total Protein 6.7 6.0 - 8.5 g/dL   Albumin 3.7 (L) 3.8 - 4.9 g/dL   Globulin, Total 3.0 1.5 - 4.5 g/dL   Albumin/Globulin Ratio 1.2 1.2 - 2.2   Bilirubin Total 0.7 0.0 - 1.2 mg/dL   Alkaline Phosphatase 157 (H) 48 - 121 IU/L   AST 49 (H) 0 - 40 IU/L   ALT 75 (H) 0 - 44 IU/L  CBC with Differential/Platelet  Result Value Ref Range   WBC 8.7 3.4 - 10.8 x10E3/uL   RBC 4.49 4.14 - 5.80 x10E6/uL   Hemoglobin 14.1 13.0 - 17.7 g/dL   Hematocrit 62.3 76.2 - 51.0 %   MCV 95 79 - 97 fL   MCH 31.4 26.6 - 33.0 pg   MCHC 33.1 31 - 35 g/dL   RDW 83.1 51.7 - 61.6 %   Platelets 313 150 - 450 x10E3/uL   Neutrophils 65 Not Estab. %   Lymphs 20 Not Estab. %   Monocytes 9 Not Estab. %   Eos 5 Not Estab. %   Basos 1 Not Estab. %   Neutrophils Absolute 5.8 1 - 7 x10E3/uL   Lymphocytes Absolute 1.7 0 - 3 x10E3/uL   Monocytes Absolute 0.7 0 - 0 x10E3/uL   EOS (ABSOLUTE) 0.4 0.0 - 0.4 x10E3/uL   Basophils Absolute 0.1 0 - 0  x10E3/uL   Immature Granulocytes 0 Not Estab. %   Immature Grans (Abs) 0.0 0.0 - 0.1 x10E3/uL  B Nat Peptide  Result Value Ref Range   BNP 66.6 0.0 - 100.0 pg/mL      Assessment & Plan:   Problem List Items Addressed This Visit      Cardiovascular and Mediastinum   Hypertension associated with diabetes (HCC)    Chronic, ongoing.  Headaches likely secondary to ongoing elevated blood pressure.  Will start lisinopril today for elevated BP and to protect kidneys with diabetes.  Appears not to  interact with cyclosporine.  Also continue metformin 500 mg bid for diabetes.  Due for HbA1c recheck in September.  Will also check urine microalbumin at that time.  Referral to Podiatry with [facility] generated today for diabetic foot and toenail management.  Continue diabetic diet.  Follow up in 4 weeks to measure benefit of lisinopril and check BMP.      Relevant Medications   lisinopril (ZESTRIL) 10 MG tablet     Endocrine   Type 2 diabetes mellitus with hyperglycemia, without long-term current use of insulin (HCC) - Primary    Chronic, ongoing.  Continue metformin 500 mg bid.  Due for HbA1c recheck in September.  Will also check urine microalbumin at that time.  Referral to Podiatry with [facility] generated today for diabetic foot and toenail management.  Continue diabetic diet.      Relevant Medications   lisinopril (ZESTRIL) 10 MG tablet   Other Relevant Orders   Ambulatory referral to Podiatry     Musculoskeletal and Integument   Plaque psoriasis    Chronic, appears to be much improved.  Continue to follow closely with [facility] for long-term monitoring of cyclosporine and consentyx.          Follow up plan: Return in about 1 month (around 12/25/2019) for blood pressure f/u.

## 2019-11-24 NOTE — Assessment & Plan Note (Signed)
Chronic, appears to be much improved.  Continue to follow closely with [facility] for long-term monitoring of cyclosporine and consentyx.

## 2019-11-24 NOTE — Assessment & Plan Note (Signed)
Chronic, ongoing.  Continue metformin 500 mg bid.  Due for HbA1c recheck in September.  Will also check urine microalbumin at that time.  Referral to Podiatry with [facility] generated today for diabetic foot and toenail management.  Continue diabetic diet.

## 2019-12-01 ENCOUNTER — Ambulatory Visit: Payer: Self-pay | Admitting: Dermatology

## 2019-12-02 ENCOUNTER — Telehealth: Payer: Self-pay

## 2019-12-02 NOTE — Telephone Encounter (Signed)
12/02/19 Spoke with patient about [employer].  He is being treated at [facility] and he may be able to get his medications at no charge he should know something soon.  He is not interested in applying for Affordable Healthcare right now because he may be relocating to [place] soon.  Patient stated that he did not need further assistance at this time. Emailed information to patient with my contact information.  Closing referral. Olean Ree [phone]

## 2019-12-02 NOTE — Telephone Encounter (Signed)
12/02/19 Entered in error. Olean Ree [phone]

## 2019-12-12 ENCOUNTER — Telehealth: Payer: Self-pay | Admitting: General Practice

## 2019-12-12 ENCOUNTER — Ambulatory Visit: Payer: Self-pay | Admitting: General Practice

## 2019-12-12 DIAGNOSIS — E1165 Type 2 diabetes mellitus with hyperglycemia: Secondary | ICD-10-CM

## 2019-12-12 DIAGNOSIS — E1159 Type 2 diabetes mellitus with other circulatory complications: Secondary | ICD-10-CM

## 2019-12-12 DIAGNOSIS — R030 Elevated blood-pressure reading, without diagnosis of hypertension: Secondary | ICD-10-CM

## 2019-12-12 NOTE — Chronic Care Management (AMB) (Signed)
Care Management   Initial Visit Note  12/12/2019 Name: Jacob Owen MRN: [id number] DOB: 05-19-1965  Subjective:   Objective:  Assessment: Jacob Owen is a 55 y.o. year old male who sees Carnella Guadalajara I, NP for primary care. The care management team was consulted for assistance with care management and care coordination needs related to Disease Management Educational Needs Care Coordination Medication Management and Education.   Review of patient status, including review of consultants reports, relevant laboratory and other test results, and collaboration with appropriate care team members and the patient's provider was performed as part of comprehensive patient evaluation and provision of care management services.    SDOH (Social Determinants of Health) assessments performed: Yes See Care Plan activities for detailed interventions related to SDOH)  SDOH Interventions     Most Recent Value  SDOH Interventions  Food Insecurity Interventions Other (Comment)  [waiting to relocate. The patient is eating a lot of fast food.]  Financial Strain Interventions Other (Comment)  [the patient is waiting for an inheritance check and is going to relocate]  Housing Interventions Other (Comment)  [the patient is waiting for an inheritance check so he can relocate]       Outpatient Encounter Medications as of 12/12/2019  Medication Sig  . albuterol (VENTOLIN HFA) 108 (90 Base) MCG/ACT inhaler Inhale 2 puffs into the lungs every 6 (six) hours as needed.  Marland Kitchen aspirin EC 81 MG tablet Take 1 tablet (81 mg total) by mouth daily. Swallow whole.  . cetirizine (ZYRTEC) 10 MG tablet Take 1 tablet (10 mg total) by mouth daily.  . clobetasol ointment (TEMOVATE) 0.10 % Apply 1 application topically 2 (two) times daily.  . cycloSPORINE (SANDIMMUNE) 100 MG capsule Take 200 mg by mouth 2 (two) times daily.  . cycloSPORINE (SANDIMMUNE) 25 MG capsule Take 75 mg by mouth 2 (two) times daily.  Marland Kitchen desonide  (DESOWEN) 0.05 % ointment Apply topically.  Marland Kitchen ibuprofen (ADVIL) 200 MG tablet Take 200 mg by mouth every 6 (six) hours as needed.  Marland Kitchen ketoconazole (NIZORAL) 2 % shampoo Alternate daily with head and shoulders. Let sit on scalp for 5 minutes before rinsing  . lisinopril (ZESTRIL) 10 MG tablet Take 1 tablet (10 mg total) by mouth daily.  . metFORMIN (GLUCOPHAGE) 500 MG tablet Take 1 tablet (500 mg total) by mouth 2 (two) times daily with a meal.  . Multiple Vitamins-Minerals (MULTIVITAMIN WITH MINERALS) tablet Take 1 tablet by mouth daily.   Marland Kitchen omeprazole (PRILOSEC) 20 MG capsule Take 20 mg by mouth daily.  . Secukinumab (COSENTYX SENSOREADY PEN) 150 MG/ML SOAJ Inject into the skin. (Patient not taking: Reported on 11/24/2019)  . triamcinolone cream (KENALOG) 0.1 % Apply 1 application topically 2 (two) times daily.   No facility-administered encounter medications on file as of 12/12/2019.    Goals Addressed              This Visit's Progress   .  RNCM- Pt-"I have not picked up the medication yet" (pt-stated)        CARE PLAN ENTRY (see longtitudinal plan of care for additional care plan information)  Objective:  . Last practice recorded BP readings:  BP Readings from Last 3 Encounters:  11/24/19 (!) 164/93  11/06/19 133/76  10/30/19 (!) 172/80 .   Marland Kitchen Most recent eGFR/CrCl: No results found for: EGFR  No components found for: CRCL  Current Barriers:  Marland Kitchen Knowledge Deficits related to basic understanding of hypertension pathophysiology and self care management . Knowledge  Deficits related to understanding of medications prescribed for management of hypertension . Difficulty obtaining medications . Non-adherence to prescribed medication regimen . 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 Over the next 60 days, patient will attend all scheduled  medical appointments: next appointment with pcp on 12-25-2019 . Over the next 60 days, patient will demonstrate improved adherence to prescribed treatment plan for hypertension as evidenced by taking all medications as prescribed, monitoring and recording blood pressure as directed, adhering to low sodium/DASH diet . Over the next 60 days, patient will demonstrate improved health management independence as evidenced by checking blood pressure as directed and notifying PCP if SBP>150 or DBP > 90, taking all medications as prescribe, and adhering to a low sodium diet as discussed. . Over the next 60 days, patient will verbalize basic understanding of hypertension disease process and self health management plan as evidenced by normalization of blood pressures  Interventions:  Marland Kitchen UNABLE to independently:manage HTN. The patient does not have a blood pressure cuff. States he constantly has a headache but feels that may be the reason he has a headache.  . Evaluation of current treatment plan related to hypertension self management and patient's adherence to plan as established by provider. . Provided education to patient re: stroke prevention, s/s of heart attack and stroke, DASH diet, complications of uncontrolled blood pressure.  The patient is eating a lot of fast foods due to his current living conditions. Education and support.  . Reviewed medications with patient and discussed importance of compliance.  The patient has not picked up his lisinopirl yet. States he is short on cash. Education on the importance of taking medications as prescribed. Has information for the medication management team.  . Discussed plans with patient for ongoing care management follow up and provided patient with direct contact information for care management team . Advised patient, providing education and rationale, to monitor blood pressure daily and record, calling PCP for findings outside established parameters.  . Reviewed  scheduled/upcoming provider appointments including: 12-25-2019 at 1:20 pm  Patient Self Care Activities:  . UNABLE to independently manage HTN . Self administers medications as prescribed . Calls provider office for new concerns, questions, or BP outside discussed parameters . Checks BP and records as discussed . Follows a low sodium diet/DASH diet  Initial goal documentation      .  RNCM-Pt-"I am not checking my blood sugars" (pt-stated)        CARE PLAN ENTRY (see longtitudinal plan of care for additional care plan information)  Objective:  Lab Results  Component Value Date   HGBA1C 7.7 (H) 10/20/2019 .   Lab Results  Component Value Date   CREATININE 1.02 10/20/2019   CREATININE 1.03 10/06/2019 .   Marland Kitchen No results found for: EGFR  Current Barriers:  Marland Kitchen Knowledge Deficits related to basic Diabetes pathophysiology and self care/management . Knowledge Deficits related to medications used for management of diabetes . Difficulty obtaining or cannot afford medications . Does not have glucometer to monitor blood sugar . Film/video editor . Limited Social Support  Case Manager Clinical Goal(s):  Over the next 120 days, patient will demonstrate improved adherence to prescribed treatment plan for diabetes self care/management as evidenced by:  . daily monitoring and recording of CBG  . adherence to ADA/ carb modified diet . exercise 3/4 days/week . adherence to prescribed medication  regimen  Interventions:  . Provided education to patient about basic DM disease process . Reviewed medications with patient and discussed importance of medication adherence . Discussed plans with patient for ongoing care management follow up and provided patient with direct contact information for care management team . Provided patient with written educational materials related to hypo and hyperglycemia and importance of correct treatment . Reviewed scheduled/upcoming provider appointments  including: 12-25-2019 ask the patient to discuss if he needs to check his blood sugars on a regular basis . Advised patient, providing education and rationale, to check cbg per direction of the pcp and record, calling pcp for findings outside established parameters.   . Referral made to community resources care guide team for assistance with expressed needs. The patient has talked to care guides and information has been provided. The patient states when he gets his inheritance check he will be relocating to a different area.  . Review of patient status, including review of consultants reports, relevant laboratory and other test results, and medications completed.  Patient Self Care Activities:  . UNABLE to independently manage diabetes . Self administers oral medications as prescribed . Checks blood sugars as prescribed and utilize hyper and hypoglycemia protocol as needed . Adheres to prescribed ADA/carb modified  Initial goal documentation         Follow up plan:  Telephone follow up appointment with care management team member scheduled for: 01-23-2020 at 345pm  Mr. Wakefield was given information about Care Management services today including:  1. Care Management services include personalized support from designated clinical staff supervised by a physician, including individualized plan of care and coordination with other care providers 2. 24/7 contact phone numbers for assistance for urgent and routine care needs. 3. The patient may stop Care Management services at any time (effective at the end of the month) by phone call to the office staff.  Patient agreed to services and verbal consent obtained.  Noreene Larsson RN, MSN, [facility] Mobile: [phone]

## 2019-12-12 NOTE — Patient Instructions (Addendum)
Visit Information  Goals Addressed              This Visit's Progress   .  RNCM- Pt-"I have not picked up the medication yet" (pt-stated)        CARE PLAN ENTRY (see longtitudinal plan of care for additional care plan information)  Objective:  . Last practice recorded BP readings:  BP Readings from Last 3 Encounters:  11/24/19 (!) 164/93  11/06/19 133/76  10/30/19 (!) 172/80 .   Marland Kitchen Most recent eGFR/CrCl: No results found for: EGFR  No components found for: CRCL  Current Barriers:  Marland Kitchen Knowledge Deficits related to basic understanding of hypertension pathophysiology and self care management . Knowledge Deficits related to understanding of medications prescribed for management of hypertension . Difficulty obtaining medications . Non-adherence to prescribed medication regimen . 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 Over the next 60 days, patient will attend all scheduled medical appointments: next appointment with pcp on 12-25-2019 . Over the next 60 days, patient will demonstrate improved adherence to prescribed treatment plan for hypertension as evidenced by taking all medications as prescribed, monitoring and recording blood pressure as directed, adhering to low sodium/DASH diet . Over the next 60 days, patient will demonstrate improved health management independence as evidenced by checking blood pressure as directed and notifying PCP if SBP>150 or DBP > 90, taking all medications as prescribe, and adhering to a low sodium diet as discussed. . Over the next 60 days, patient will verbalize basic understanding of hypertension disease process and self health management plan as evidenced by normalization of blood pressures  Interventions:  Marland Kitchen UNABLE to independently:manage HTN. The patient does not have a blood pressure cuff. States he  constantly has a headache but feels that may be the reason he has a headache.  . Evaluation of current treatment plan related to hypertension self management and patient's adherence to plan as established by provider. . Provided education to patient re: stroke prevention, s/s of heart attack and stroke, DASH diet, complications of uncontrolled blood pressure.  The patient is eating a lot of fast foods due to his current living conditions. Education and support.  . Reviewed medications with patient and discussed importance of compliance.  The patient has not picked up his lisinopirl yet. States he is short on cash. Education on the importance of taking medications as prescribed. Has information for the medication management team.  . Discussed plans with patient for ongoing care management follow up and provided patient with direct contact information for care management team . Advised patient, providing education and rationale, to monitor blood pressure daily and record, calling PCP for findings outside established parameters.  . Reviewed scheduled/upcoming provider appointments including: 12-25-2019 at 1:20 pm  Patient Self Care Activities:  . UNABLE to independently manage HTN . Self administers medications as prescribed . Calls provider office for new concerns, questions, or BP outside discussed parameters . Checks BP and records as discussed . Follows a low sodium diet/DASH diet  Initial goal documentation      .  RNCM-Pt-"I am not checking my blood sugars" (pt-stated)        CARE PLAN ENTRY (see longtitudinal plan of care for additional care plan information)  Objective:  Lab Results  Component Value Date   HGBA1C 7.7 (H) 10/20/2019 .   Lab Results  Component Value Date  CREATININE 1.02 10/20/2019   CREATININE 1.03 10/06/2019 .   Marland Kitchen No results found for: EGFR  Current Barriers:  Marland Kitchen Knowledge Deficits related to basic Diabetes pathophysiology and self care/management . Knowledge  Deficits related to medications used for management of diabetes . Difficulty obtaining or cannot afford medications . Does not have glucometer to monitor blood sugar . Film/video editor . Limited Social Support  Case Manager Clinical Goal(s):  Over the next 120 days, patient will demonstrate improved adherence to prescribed treatment plan for diabetes self care/management as evidenced by:  . daily monitoring and recording of CBG  . adherence to ADA/ carb modified diet . exercise 3/4 days/week . adherence to prescribed medication regimen  Interventions:  . Provided education to patient about basic DM disease process . Reviewed medications with patient and discussed importance of medication adherence . Discussed plans with patient for ongoing care management follow up and provided patient with direct contact information for care management team . Provided patient with written educational materials related to hypo and hyperglycemia and importance of correct treatment . Reviewed scheduled/upcoming provider appointments including: 12-25-2019 ask the patient to discuss if he needs to check his blood sugars on a regular basis . Advised patient, providing education and rationale, to check cbg per direction of the pcp and record, calling pcp for findings outside established parameters.   . Referral made to community resources care guide team for assistance with expressed needs. The patient has talked to care guides and information has been provided. The patient states when he gets his inheritance check he will be relocating to a different area.  . Review of patient status, including review of consultants reports, relevant laboratory and other test results, and medications completed.  Patient Self Care Activities:  . UNABLE to independently manage diabetes . Self administers oral medications as prescribed . Checks blood sugars as prescribed and utilize hyper and hypoglycemia protocol as  needed . Adheres to prescribed ADA/carb modified  Initial goal documentation        Jacob Owen was given information about Care Management services today including:  1. Care Management services include personalized support from designated clinical staff supervised by his physician, including individualized plan of care and coordination with other care providers 2. 24/7 contact phone numbers for assistance for urgent and routine care needs. 3. The patient may stop CCM services at any time (effective at the end of the month) by phone call to the office staff.  Patient agreed to services and verbal consent obtained.   Patient verbalizes understanding of instructions provided today.   Telephone follow up appointment with care management team member scheduled for: 01-23-2020 at 345 pm  Noreene Larsson RN, MSN, [facility] Mobile: [phone]   DASH Eating Plan DASH stands for "Dietary Approaches to Stop Hypertension." The DASH eating plan is a healthy eating plan that has been shown to reduce high blood pressure (hypertension). It may also reduce your risk for type 2 diabetes, heart disease, and stroke. The DASH eating plan may also help with weight loss. What are tips for following this plan?  General guidelines  Avoid eating more than 2,300 mg (milligrams) of salt (sodium) a day. If you have hypertension, you may need to reduce your sodium intake to 1,500 mg a day.  Limit alcohol intake to no more than 1 drink a day for nonpregnant women and 2 drinks a day for men. One drink equals 12 oz  of beer, 5 oz of wine, or 1 oz of hard liquor.  Work with your health care provider to maintain a healthy body weight or to lose weight. Ask what an ideal weight is for you.  Get at least 30 minutes of exercise that causes your heart to beat faster (aerobic exercise) most days of the week. Activities may include walking,  swimming, or biking.  Work with your health care provider or diet and nutrition specialist (dietitian) to adjust your eating plan to your individual calorie needs. Reading food labels   Check food labels for the amount of sodium per serving. Choose foods with less than 5 percent of the Daily Value of sodium. Generally, foods with less than 300 mg of sodium per serving fit into this eating plan.  To find whole grains, look for the word "whole" as the first word in the ingredient list. Shopping  Buy products labeled as "low-sodium" or "no salt added."  Buy fresh foods. Avoid canned foods and premade or frozen meals. Cooking  Avoid adding salt when cooking. Use salt-free seasonings or herbs instead of table salt or sea salt. Check with your health care provider or pharmacist before using salt substitutes.  Do not fry foods. Cook foods using healthy methods such as baking, boiling, grilling, and broiling instead.  Cook with heart-healthy oils, such as olive, canola, soybean, or sunflower oil. Meal planning  Eat a balanced diet that includes: ? 5 or more servings of fruits and vegetables each day. At each meal, try to fill half of your plate with fruits and vegetables. ? Up to 6-8 servings of whole grains each day. ? Less than 6 oz of lean meat, poultry, or fish each day. A 3-oz serving of meat is about the same size as a deck of cards. One egg equals 1 oz. ? 2 servings of low-fat dairy each day. ? A serving of nuts, seeds, or beans 5 times each week. ? Heart-healthy fats. Healthy fats called Omega-3 fatty acids are found in foods such as flaxseeds and coldwater fish, like sardines, salmon, and mackerel.  Limit how much you eat of the following: ? Canned or prepackaged foods. ? Food that is high in trans fat, such as fried foods. ? Food that is high in saturated fat, such as fatty meat. ? Sweets, desserts, sugary drinks, and other foods with added sugar. ? Full-fat dairy  products.  Do not salt foods before eating.  Try to eat at least 2 vegetarian meals each week.  Eat more home-cooked food and less restaurant, buffet, and fast food.  When eating at a restaurant, ask that your food be prepared with less salt or no salt, if possible. What foods are recommended? The items listed may not be a complete list. Talk with your dietitian about what dietary choices are best for you. Grains Whole-grain or whole-wheat bread. Whole-grain or whole-wheat pasta. Brown rice. [place]. Bulgur. Whole-grain and low-sodium cereals. Pita bread. Low-fat, low-sodium crackers. Whole-wheat flour tortillas. Vegetables Fresh or frozen vegetables (raw, steamed, roasted, or grilled). Low-sodium or reduced-sodium tomato and vegetable juice. Low-sodium or reduced-sodium tomato sauce and tomato paste. Low-sodium or reduced-sodium canned vegetables. Fruits All fresh, dried, or frozen fruit. Canned fruit in natural juice (without added sugar). Meat and other protein foods Skinless chicken or [place]. Ground chicken or [place]. Pork with fat trimmed off. Fish and seafood. Egg whites. Dried beans, peas, or lentils. Unsalted nuts, nut butters, and seeds. Unsalted canned beans. Lean cuts of beef with  fat trimmed off. Low-sodium, lean deli meat. Dairy Low-fat (1%) or fat-free (skim) milk. Fat-free, low-fat, or reduced-fat cheeses. Nonfat, low-sodium ricotta or cottage cheese. Low-fat or nonfat yogurt. Low-fat, low-sodium cheese. Fats and oils Soft margarine without trans fats. Vegetable oil. Low-fat, reduced-fat, or light mayonnaise and salad dressings (reduced-sodium). Canola, safflower, olive, soybean, and sunflower oils. Avocado. Seasoning and other foods Herbs. Spices. Seasoning mixes without salt. Unsalted popcorn and pretzels. Fat-free sweets. What foods are not recommended? The items listed may not be a complete list. Talk with your dietitian about what dietary choices are best for  you. Grains Baked goods made with fat, such as croissants, muffins, or some breads. Dry pasta or rice meal packs. Vegetables Creamed or fried vegetables. Vegetables in a cheese sauce. Regular canned vegetables (not low-sodium or reduced-sodium). Regular canned tomato sauce and paste (not low-sodium or reduced-sodium). Regular tomato and vegetable juice (not low-sodium or reduced-sodium). Angie Fava. Olives. Fruits Canned fruit in a light or heavy syrup. Fried fruit. Fruit in cream or butter sauce. Meat and other protein foods Fatty cuts of meat. Ribs. Fried meat. Berniece Salines. Sausage. Bologna and other processed lunch meats. Salami. Fatback. Hotdogs. Bratwurst. Salted nuts and seeds. Canned beans with added salt. Canned or smoked fish. Whole eggs or egg yolks. Chicken or [place] with skin. Dairy Whole or 2% milk, cream, and half-and-half. Whole or full-fat cream cheese. Whole-fat or sweetened yogurt. Full-fat cheese. Nondairy creamers. Whipped toppings. Processed cheese and cheese spreads. Fats and oils Butter. Stick margarine. Lard. Shortening. Ghee. Bacon fat. Tropical oils, such as coconut, palm kernel, or palm oil. Seasoning and other foods Salted popcorn and pretzels. Onion salt, garlic salt, seasoned salt, table salt, and sea salt. Worcestershire sauce. Tartar sauce. Barbecue sauce. Teriyaki sauce. Soy sauce, including reduced-sodium. Steak sauce. Canned and packaged gravies. Fish sauce. Oyster sauce. Cocktail sauce. Horseradish that you find on the shelf. Ketchup. Mustard. Meat flavorings and tenderizers. Bouillon cubes. Hot sauce and Tabasco sauce. Premade or packaged marinades. Premade or packaged taco seasonings. Relishes. Regular salad dressings. Where to find more information:  National Heart, Lung, and [place]: https://wilson-eaton.com/  American Heart Association: www.heart.org Summary  The DASH eating plan is a healthy eating plan that has been shown to reduce high blood pressure  (hypertension). It may also reduce your risk for type 2 diabetes, heart disease, and stroke.  With the DASH eating plan, you should limit salt (sodium) intake to 2,300 mg a day. If you have hypertension, you may need to reduce your sodium intake to 1,500 mg a day.  When on the DASH eating plan, aim to eat more fresh fruits and vegetables, whole grains, lean proteins, low-fat dairy, and heart-healthy fats.  Work with your health care provider or diet and nutrition specialist (dietitian) to adjust your eating plan to your individual calorie needs. This information is not intended to replace advice given to you by your health care provider. Make sure you discuss any questions you have with your health care provider. Document Revised: 03/30/2017 Document Reviewed: 04/10/2016 Elsevier Patient Education  2020 [place] for Diabetes Mellitus, Adult  Carbohydrate counting is a method of keeping track of how many carbohydrates you eat. Eating carbohydrates naturally increases the amount of sugar (glucose) in the blood. Counting how many carbohydrates you eat helps keep your blood glucose within normal limits, which helps you manage your diabetes (diabetes mellitus). It is important to know how many carbohydrates you can safely have in each meal. This is different for  every person. A diet and nutrition specialist (registered dietitian) can help you make a meal plan and calculate how many carbohydrates you should have at each meal and snack. Carbohydrates are found in the following foods: Grains, such as breads and cereals. Dried beans and soy products. Starchy vegetables, such as potatoes, peas, and corn. Fruit and fruit juices. Milk and yogurt. Sweets and snack foods, such as cake, cookies, candy, chips, and soft drinks. How do I count carbohydrates? There are two ways to count carbohydrates in food. You can use either of the methods or a combination of both. Reading  "Nutrition Facts" on packaged food The "Nutrition Facts" list is included on the labels of almost all packaged foods and beverages in the [place] It includes: The serving size. Information about nutrients in each serving, including the grams (g) of carbohydrate per serving. To use the "Nutrition Facts": Decide how many servings you will have. Multiply the number of servings by the number of carbohydrates per serving. The resulting number is the total amount of carbohydrates that you will be having. Learning standard serving sizes of other foods When you eat carbohydrate foods that are not packaged or do not include "Nutrition Facts" on the label, you need to measure the servings in order to count the amount of carbohydrates: Measure the foods that you will eat with a food scale or measuring cup, if needed. Decide how many standard-size servings you will eat. Multiply the number of servings by 15. Most carbohydrate-rich foods have about 15 g of carbohydrates per serving. For example, if you eat 8 oz (170 g) of strawberries, you will have eaten 2 servings and 30 g of carbohydrates (2 servings x 15 g = 30 g). For foods that have more than one food mixed, such as soups and casseroles, you must count the carbohydrates in each food that is included. The following list contains standard serving sizes of common carbohydrate-rich foods. Each of these servings has about 15 g of carbohydrates:  hamburger bun or  English muffin.  oz (15 mL) syrup.  oz (14 g) jelly. 1 slice of bread. 1 six-inch tortilla. 3 oz (85 g) cooked rice or pasta. 4 oz (113 g) cooked dried beans. 4 oz (113 g) starchy vegetable, such as peas, corn, or potatoes. 4 oz (113 g) hot cereal. 4 oz (113 g) mashed potatoes or  of a large baked potato. 4 oz (113 g) canned or frozen fruit. 4 oz (120 mL) fruit juice. 4-6 crackers. 6 chicken nuggets. 6 oz (170 g) unsweetened dry cereal. 6 oz (170 g) plain fat-free yogurt or yogurt  sweetened with artificial sweeteners. 8 oz (240 mL) milk. 8 oz (170 g) fresh fruit or one small piece of fruit. 24 oz (680 g) popped popcorn. Example of carbohydrate counting Sample meal 3 oz (85 g) chicken breast. 6 oz (170 g) brown rice. 4 oz (113 g) corn. 8 oz (240 mL) milk. 8 oz (170 g) strawberries with sugar-free whipped topping. Carbohydrate calculation Identify the foods that contain carbohydrates: Rice. Corn. Milk. Strawberries. Calculate how many servings you have of each food: 2 servings rice. 1 serving corn. 1 serving milk. 1 serving strawberries. Multiply each number of servings by 15 g: 2 servings rice x 15 g = 30 g. 1 serving corn x 15 g = 15 g. 1 serving milk x 15 g = 15 g. 1 serving strawberries x 15 g = 15 g. Add together all of the amounts to find the total grams of  carbohydrates eaten: 30 g + 15 g + 15 g + 15 g = 75 g of carbohydrates total. Summary Carbohydrate counting is a method of keeping track of how many carbohydrates you eat. Eating carbohydrates naturally increases the amount of sugar (glucose) in the blood. Counting how many carbohydrates you eat helps keep your blood glucose within normal limits, which helps you manage your diabetes. A diet and nutrition specialist (registered dietitian) can help you make a meal plan and calculate how many carbohydrates you should have at each meal and snack. This information is not intended to replace advice given to you by your health care provider. Make sure you discuss any questions you have with your health care provider. Document Revised: 11/09/2016 Document Reviewed: 09/29/2015 Elsevier Patient Education  [place].

## 2019-12-22 ENCOUNTER — Ambulatory Visit: Payer: Self-pay | Admitting: Dermatology

## 2019-12-25 ENCOUNTER — Other Ambulatory Visit: Payer: Self-pay

## 2019-12-25 ENCOUNTER — Encounter: Payer: Self-pay | Admitting: Nurse Practitioner

## 2019-12-25 ENCOUNTER — Ambulatory Visit (INDEPENDENT_AMBULATORY_CARE_PROVIDER_SITE_OTHER): Payer: Self-pay | Admitting: Nurse Practitioner

## 2019-12-25 ENCOUNTER — Ambulatory Visit: Payer: Self-pay | Admitting: Dermatology

## 2019-12-25 VITALS — BP 161/88 | HR 66 | Temp 98.4°F | Wt 233.4 lb

## 2019-12-25 DIAGNOSIS — E1165 Type 2 diabetes mellitus with hyperglycemia: Secondary | ICD-10-CM

## 2019-12-25 DIAGNOSIS — I10 Essential (primary) hypertension: Secondary | ICD-10-CM

## 2019-12-25 DIAGNOSIS — E1159 Type 2 diabetes mellitus with other circulatory complications: Secondary | ICD-10-CM

## 2019-12-25 MED ORDER — LISINOPRIL 10 MG PO TABS: 10.0000 mg | ORAL_TABLET | Freq: Every day | ORAL | 0 refills | Status: AC

## 2019-12-25 MED ORDER — METFORMIN HCL 500 MG PO TABS: 500.0000 mg | ORAL_TABLET | Freq: Two times a day (BID) | ORAL | 1 refills | Status: AC

## 2019-12-25 NOTE — Patient Instructions (Signed)
DASH Eating Plan DASH stands for "Dietary Approaches to Stop Hypertension." The DASH eating plan is a healthy eating plan that has been shown to reduce high blood pressure (hypertension). It may also reduce your risk for type 2 diabetes, heart disease, and stroke. The DASH eating plan may also help with weight loss. What are tips for following this plan?  General guidelines  Avoid eating more than 2,300 mg (milligrams) of salt (sodium) a day. If you have hypertension, you may need to reduce your sodium intake to 1,500 mg a day.  Limit alcohol intake to no more than 1 drink a day for nonpregnant women and 2 drinks a day for men. One drink equals 12 oz of beer, 5 oz of wine, or 1 oz of hard liquor.  Work with your health care provider to maintain a healthy body weight or to lose weight. Ask what an ideal weight is for you.  Get at least 30 minutes of exercise that causes your heart to beat faster (aerobic exercise) most days of the week. Activities may include walking, swimming, or biking.  Work with your health care provider or diet and nutrition specialist (dietitian) to adjust your eating plan to your individual calorie needs. Reading food labels   Check food labels for the amount of sodium per serving. Choose foods with less than 5 percent of the Daily Value of sodium. Generally, foods with less than 300 mg of sodium per serving fit into this eating plan.  To find whole grains, look for the word "whole" as the first word in the ingredient list. Shopping  Buy products labeled as "low-sodium" or "no salt added."  Buy fresh foods. Avoid canned foods and premade or frozen meals. Cooking  Avoid adding salt when cooking. Use salt-free seasonings or herbs instead of table salt or sea salt. Check with your health care provider or pharmacist before using salt substitutes.  Do not fry foods. Cook foods using healthy methods such as baking, boiling, grilling, and broiling instead.  Cook with  heart-healthy oils, such as olive, canola, soybean, or sunflower oil. Meal planning  Eat a balanced diet that includes: ? 5 or more servings of fruits and vegetables each day. At each meal, try to fill half of your plate with fruits and vegetables. ? Up to 6-8 servings of whole grains each day. ? Less than 6 oz of lean meat, poultry, or fish each day. A 3-oz serving of meat is about the same size as a deck of cards. One egg equals 1 oz. ? 2 servings of low-fat dairy each day. ? A serving of nuts, seeds, or beans 5 times each week. ? Heart-healthy fats. Healthy fats called Omega-3 fatty acids are found in foods such as flaxseeds and coldwater fish, like sardines, salmon, and mackerel.  Limit how much you eat of the following: ? Canned or prepackaged foods. ? Food that is high in trans fat, such as fried foods. ? Food that is high in saturated fat, such as fatty meat. ? Sweets, desserts, sugary drinks, and other foods with added sugar. ? Full-fat dairy products.  Do not salt foods before eating.  Try to eat at least 2 vegetarian meals each week.  Eat more home-cooked food and less restaurant, buffet, and fast food.  When eating at a restaurant, ask that your food be prepared with less salt or no salt, if possible. What foods are recommended? The items listed may not be a complete list. Talk with your dietitian about   what dietary choices are best for you. Grains Whole-grain or whole-wheat bread. Whole-grain or whole-wheat pasta. Brown rice. Oatmeal. Quinoa. Bulgur. Whole-grain and low-sodium cereals. Pita bread. Low-fat, low-sodium crackers. Whole-wheat flour tortillas. Vegetables Fresh or frozen vegetables (raw, steamed, roasted, or grilled). Low-sodium or reduced-sodium tomato and vegetable juice. Low-sodium or reduced-sodium tomato sauce and tomato paste. Low-sodium or reduced-sodium canned vegetables. Fruits All fresh, dried, or frozen fruit. Canned fruit in natural juice (without  added sugar). Meat and other protein foods Skinless chicken or turkey. Ground chicken or turkey. Pork with fat trimmed off. Fish and seafood. Egg whites. Dried beans, peas, or lentils. Unsalted nuts, nut butters, and seeds. Unsalted canned beans. Lean cuts of beef with fat trimmed off. Low-sodium, lean deli meat. Dairy Low-fat (1%) or fat-free (skim) milk. Fat-free, low-fat, or reduced-fat cheeses. Nonfat, low-sodium ricotta or cottage cheese. Low-fat or nonfat yogurt. Low-fat, low-sodium cheese. Fats and oils Soft margarine without trans fats. Vegetable oil. Low-fat, reduced-fat, or light mayonnaise and salad dressings (reduced-sodium). Canola, safflower, olive, soybean, and sunflower oils. Avocado. Seasoning and other foods Herbs. Spices. Seasoning mixes without salt. Unsalted popcorn and pretzels. Fat-free sweets. What foods are not recommended? The items listed may not be a complete list. Talk with your dietitian about what dietary choices are best for you. Grains Baked goods made with fat, such as croissants, muffins, or some breads. Dry pasta or rice meal packs. Vegetables Creamed or fried vegetables. Vegetables in a cheese sauce. Regular canned vegetables (not low-sodium or reduced-sodium). Regular canned tomato sauce and paste (not low-sodium or reduced-sodium). Regular tomato and vegetable juice (not low-sodium or reduced-sodium). Pickles. Olives. Fruits Canned fruit in a light or heavy syrup. Fried fruit. Fruit in cream or butter sauce. Meat and other protein foods Fatty cuts of meat. Ribs. Fried meat. Bacon. Sausage. Bologna and other processed lunch meats. Salami. Fatback. Hotdogs. Bratwurst. Salted nuts and seeds. Canned beans with added salt. Canned or smoked fish. Whole eggs or egg yolks. Chicken or turkey with skin. Dairy Whole or 2% milk, cream, and half-and-half. Whole or full-fat cream cheese. Whole-fat or sweetened yogurt. Full-fat cheese. Nondairy creamers. Whipped toppings.  Processed cheese and cheese spreads. Fats and oils Butter. Stick margarine. Lard. Shortening. Ghee. Bacon fat. Tropical oils, such as coconut, palm kernel, or palm oil. Seasoning and other foods Salted popcorn and pretzels. Onion salt, garlic salt, seasoned salt, table salt, and sea salt. Worcestershire sauce. Tartar sauce. Barbecue sauce. Teriyaki sauce. Soy sauce, including reduced-sodium. Steak sauce. Canned and packaged gravies. Fish sauce. Oyster sauce. Cocktail sauce. Horseradish that you find on the shelf. Ketchup. Mustard. Meat flavorings and tenderizers. Bouillon cubes. Hot sauce and Tabasco sauce. Premade or packaged marinades. Premade or packaged taco seasonings. Relishes. Regular salad dressings. Where to find more information:  National Heart, Lung, and Blood Institute: www.nhlbi.nih.gov  American Heart Association: www.heart.org Summary  The DASH eating plan is a healthy eating plan that has been shown to reduce high blood pressure (hypertension). It may also reduce your risk for type 2 diabetes, heart disease, and stroke.  With the DASH eating plan, you should limit salt (sodium) intake to 2,300 mg a day. If you have hypertension, you may need to reduce your sodium intake to 1,500 mg a day.  When on the DASH eating plan, aim to eat more fresh fruits and vegetables, whole grains, lean proteins, low-fat dairy, and heart-healthy fats.  Work with your health care provider or diet and nutrition specialist (dietitian) to adjust your eating plan to your   individual calorie needs. This information is not intended to replace advice given to you by your health care provider. Make sure you discuss any questions you have with your health care provider. Document Revised: 03/30/2017 Document Reviewed: 04/10/2016 Elsevier Patient Education  2020 Elsevier Inc.  

## 2019-12-25 NOTE — Assessment & Plan Note (Signed)
Chronic, ongoing.  Has not picked up BP medication yet, will send to local pharmacy and encouraged patient to pick up and start medication.  Also to continue metformin 500 mg bid.  Follow up in 1 month.

## 2019-12-25 NOTE — Assessment & Plan Note (Signed)
Chronic, ongoing.  Seems to be tolerating metformin well, will continue and check A1c in 1 month.  Continue to decrease simple sugars in diet and carbohydrates.

## 2019-12-25 NOTE — Progress Notes (Signed)
BP (!) 161/88   Pulse 66   Temp 98.4 F (36.9 C) (Oral)   Wt 233 lb 6.4 oz (105.9 kg)   SpO2 96%   BMI 32.55 kg/m    Subjective:    Patient ID: Jacob Owen, male    DOB: Jun 07, 1965, 54 y.o.   MRN: [id number]  HPI: Jacob Owen is a 54 y.o. male presenting for BP follow up.  Chief Complaint  Patient presents with  . Hypertension    4 week f/up- did not pick up lisinopril yet   PSORIASIS - doing much better. Stopped the cyclosporine and is only taking the consentyx.  HYPERTENSION - has not picked up his BP medication yet.  Is planning to later today.  Did not want to use the [facility].  DIABETES - taking metformin as prescribed, seems to be tolerating it.  Is continuing to cut back on pepsi. Needs refill today.    Next month, mother's estate money will be disbursed.  Within 1 month of that, he will be moving most likely to [place].    Allergies  Allergen Reactions  . Chocolate     Other reaction(s): ANAPHYLAXIS  . Codeine Nausea And Vomiting  . Other Rash    Bed bugs.   . Penicillins Itching   Outpatient Encounter Medications as of 12/25/2019  Medication Sig  . albuterol (VENTOLIN HFA) 108 (90 Base) MCG/ACT inhaler Inhale 2 puffs into the lungs every 6 (six) hours as needed.  Marland Kitchen aspirin EC 81 MG tablet Take 1 tablet (81 mg total) by mouth daily. Swallow whole.  . cetirizine (ZYRTEC) 10 MG tablet Take 1 tablet (10 mg total) by mouth daily.  . clobetasol ointment (TEMOVATE) 0.05 % Apply 1 application topically 2 (two) times daily.  Marland Kitchen desonide (DESOWEN) 0.05 % ointment Apply topically.  Marland Kitchen ibuprofen (ADVIL) 200 MG tablet Take 200 mg by mouth every 6 (six) hours as needed.  Marland Kitchen ketoconazole (NIZORAL) 2 % shampoo Alternate daily with head and shoulders. Let sit on scalp for 5 minutes before rinsing  . metFORMIN (GLUCOPHAGE) 500 MG tablet Take 1 tablet (500 mg total) by mouth 2 (two) times daily with a meal.  . Multiple Vitamins-Minerals (MULTIVITAMIN  WITH MINERALS) tablet Take 1 tablet by mouth daily.   Marland Kitchen omeprazole (PRILOSEC) 20 MG capsule Take 20 mg by mouth daily.  . Secukinumab (COSENTYX SENSOREADY PEN) 150 MG/ML SOAJ Inject into the skin.   Marland Kitchen triamcinolone cream (KENALOG) 0.1 % Apply 1 application topically 2 (two) times daily.  . [DISCONTINUED] metFORMIN (GLUCOPHAGE) 500 MG tablet Take 1 tablet (500 mg total) by mouth 2 (two) times daily with a meal.  . cycloSPORINE (SANDIMMUNE) 100 MG capsule Take 200 mg by mouth 2 (two) times daily. (Patient not taking: Reported on 12/25/2019)  . cycloSPORINE (SANDIMMUNE) 25 MG capsule Take 75 mg by mouth 2 (two) times daily. (Patient not taking: Reported on 12/25/2019)  . lisinopril (ZESTRIL) 10 MG tablet Take 1 tablet (10 mg total) by mouth daily.  . [DISCONTINUED] lisinopril (ZESTRIL) 10 MG tablet Take 1 tablet (10 mg total) by mouth daily. (Patient not taking: Reported on 12/25/2019)   No facility-administered encounter medications on file as of 12/25/2019.   Patient Active Problem List   Diagnosis Date Noted  . Anemia due to folic acid deficiency 11/06/2019  . Esophageal dysphagia 11/06/2019  . Type 2 diabetes mellitus with hyperglycemia, without long-term current use of insulin (HCC) 10/30/2019  . Hypertension associated with diabetes (HCC) 10/30/2019  .  COPD (chronic obstructive pulmonary disease) (HCC) 10/21/2019  . Hyperlipidemia 04/19/2014  . Plaque psoriasis 04/19/2014  . Coronary artery disease involving native coronary artery of native heart without angina pectoris 04/17/2014  . Tobacco use disorder 04/17/2014   Past Medical History:  Diagnosis Date  . Alcoholism (HCC)   . Ankle fracture    Left  . Arm fracture, right   . Bradycardia   . Chronic venous stasis dermatitis of both lower extremities   . Collar bone fracture   . Concussion    x's 7. (Younger)  . COPD (chronic obstructive pulmonary disease) (HCC)   . Coronary artery disease involving native coronary artery of native  heart without angina pectoris   . Hyperlipidemia   . Myocardial infarction (HCC)   . Obesity   . Plaque psoriasis   . Right wrist fracture   . Suspected respiratory disease   . Tailbone injury   . Tobacco use disorder    Relevant past medical, surgical, family and social history reviewed and updated as indicated. Interim medical history since our last visit reviewed.  Review of Systems  Constitutional: Negative.   Gastrointestinal: Negative.   Neurological: Negative.   Psychiatric/Behavioral: Negative.     Per HPI unless specifically indicated above     Objective:    BP (!) 161/88   Pulse 66   Temp 98.4 F (36.9 C) (Oral)   Wt 233 lb 6.4 oz (105.9 kg)   SpO2 96%   BMI 32.55 kg/m   Wt Readings from Last 3 Encounters:  12/25/19 233 lb 6.4 oz (105.9 kg)  11/24/19 (!) 234 lb (106.1 kg)  11/06/19 234 lb (106.1 kg)    Physical Exam Vitals and nursing note reviewed.  Constitutional:      Appearance: Normal appearance.  Abdominal:     General: Abdomen is flat. There is no distension.  Skin:    Coloration: Skin is not jaundiced or pale.  Neurological:     General: No focal deficit present.     Mental Status: He is alert and oriented to person, place, and time.     Motor: No weakness.     Gait: Gait normal.  Psychiatric:        Mood and Affect: Mood normal.        Behavior: Behavior normal.        Thought Content: Thought content normal.        Judgment: Judgment normal.       Assessment & Plan:   Problem List Items Addressed This Visit      Cardiovascular and Mediastinum   Hypertension associated with diabetes (HCC) - Primary    Chronic, ongoing.  Has not picked up BP medication yet, will send to local pharmacy and encouraged patient to pick up and start medication.  Also to continue metformin 500 mg bid.  Follow up in 1 month.      Relevant Medications   lisinopril (ZESTRIL) 10 MG tablet   metFORMIN (GLUCOPHAGE) 500 MG tablet     Endocrine   Type 2  diabetes mellitus with hyperglycemia, without long-term current use of insulin (HCC)    Chronic, ongoing.  Seems to be tolerating metformin well, will continue and check A1c in 1 month.  Continue to decrease simple sugars in diet and carbohydrates.       Relevant Medications   lisinopril (ZESTRIL) 10 MG tablet   metFORMIN (GLUCOPHAGE) 500 MG tablet       Follow up plan: Return in about 4  weeks (around 01/22/2020) for BP, DM follow up.

## 2019-12-30 ENCOUNTER — Telehealth: Payer: Self-pay | Admitting: Pharmacist

## 2019-12-30 NOTE — Telephone Encounter (Signed)
Patient failed to provide requested 2021 financial documentation. No additional medication assistance will be provided by [facility] without the required proof of income documentation. Patient notified by letter Debra Cheek Administrative Assistant [facility] 

## 2020-01-23 ENCOUNTER — Ambulatory Visit: Payer: Self-pay | Admitting: General Practice

## 2020-01-23 ENCOUNTER — Telehealth: Payer: Self-pay | Admitting: General Practice

## 2020-01-23 DIAGNOSIS — F17200 Nicotine dependence, unspecified, uncomplicated: Secondary | ICD-10-CM

## 2020-01-23 DIAGNOSIS — I152 Hypertension secondary to endocrine disorders: Secondary | ICD-10-CM

## 2020-01-23 DIAGNOSIS — E1165 Type 2 diabetes mellitus with hyperglycemia: Secondary | ICD-10-CM

## 2020-01-23 NOTE — Patient Instructions (Signed)
Visit Information  Goals Addressed              This Visit's Progress   .  RNCM- Pt-"I have not picked up the medication yet" (pt-stated)        CARE PLAN ENTRY (see longtitudinal plan of care for additional care plan information)  Objective:  . Last practice recorded BP readings:  . BP Readings from Last 3 Encounters: .  12/25/19 . (!) 161/88 .  11/24/19 . (!) 164/93 .  11/06/19 . 133/76 .    Marland Kitchen Most recent eGFR/CrCl: No results found for: EGFR  No components found for: CRCL  Current Barriers:  Marland Kitchen Knowledge Deficits related to basic understanding of hypertension pathophysiology and self care management . Knowledge Deficits related to understanding of medications prescribed for management of hypertension . Difficulty obtaining medications . Non-adherence to prescribed medication regimen . Limited Social Support . Film/video editor.   Case Manager Clinical Goal(s):  Marland Kitchen Over the next 90 days, patient will verbalize understanding of plan for hypertension management . Over the next 90 days, patient will attend all scheduled medical appointments: saw pcp on 12-25-2019. Cancelled appointment for 01-26-2020 due to not having money to pay for appointment.  . Over the next 90 days, patient will demonstrate improved adherence to prescribed treatment plan for hypertension as evidenced by taking all medications as prescribed, monitoring and recording blood pressure as directed, adhering to low sodium/DASH diet . Over the next 90 days, patient will demonstrate improved health management independence as evidenced by checking blood pressure as directed and notifying PCP if SBP>150 or DBP > 90, taking all medications as prescribe, and adhering to a low sodium diet as discussed. . Over the next 90 days, patient will verbalize basic understanding of hypertension disease process and self health management plan as evidenced by normalization of blood pressures  Interventions:  Marland Kitchen UNABLE to  independently:manage HTN. The patient does not have a blood pressure cuff. States he constantly has a headache but feels that may be the reason he has a headache. 01-23-2020: The patient states he feels fine and doesn't have headaches as much; however still has not picked up his medication to take.  . Evaluation of current treatment plan related to hypertension self management and patient's adherence to plan as established by provider. 01-23-2020: The patient saw the pcp in the office on 12/25/2019.  He was not being compliant with the recommendations by the provider. Still is not being compliant. The patient states that he can not afford the medications and the medication management clinic can not help him get it for free unless he changes his address on his ID card and he can not do that because he is going to move once he gets his inheritance money and he would have to do this again, plus he does not have the money to do it right now anyway. He states that [facility] also would be able to help him with medications but because his registration and ID have his old address they can not unless he gets it changed. The patient states he loaned his roommate and friend money and they have not paid him back and he has no money. He soon will lose the insurance on his car and then his license unless they pay him back very soon.  . Provided education to patient re: stroke prevention, s/s of heart attack and stroke, DASH diet, complications of uncontrolled blood pressure.  The patient is eating a lot of fast foods  due to his current living conditions. Education and support. 01-23-2020: the patient states he has food stamps and has plenty of food. Is not eating any fast food because he does not have the money to buy fast food. Denies the need for delivered meals.  . Reviewed medications with patient and discussed importance of compliance.  The patient has not picked up his lisinopirl yet. 01-23-2020: The patient still has not picked up  his medications to take. States he has no money to get them at all. Discussed medication management clinic and the patient states they tried to help him but he has a different address on his ID and registration and he would have to pay to have to get it changes and he can not do that.  Education on the importance of taking medications as prescribed. Has information for the medication management team.  . Discussed plans with patient for ongoing care management follow up and provided patient with direct contact information for care management team . Advised patient, providing education and rationale, to monitor blood pressure daily and record, calling PCP for findings outside established parameters.  . Evaluation of smoking cessation. The patient currently said to let the pcp know that he is not smoking right now because he can not even buy cigarettes. The patient states as soon as he has money though he is going to buy cigarettes.  . Reviewed scheduled/upcoming provider appointments including: The patient cancelled his appointment for 01-26-2020 due to not having the money to pay for his appointment. Will let the pcp know.   Patient Self Care Activities:  . UNABLE to independently manage HTN . Self administers medications as prescribed . Calls provider office for new concerns, questions, or BP outside discussed parameters . Checks BP and records as discussed . Follows a low sodium diet/DASH diet  Please see past updates related to this goal by clicking on the "Past Updates" button in the selected goal       .  RNCM-Pt-"I am not checking my blood sugars" (pt-stated)        CARE PLAN ENTRY (see longtitudinal plan of care for additional care plan information)  Objective:  Lab Results  Component Value Date   HGBA1C 7.7 (H) 10/20/2019 .   Lab Results  Component Value Date   CREATININE 1.02 10/20/2019   CREATININE 1.03 10/06/2019 .   Marland Kitchen No results found for: EGFR  Current Barriers:   Marland Kitchen Knowledge Deficits related to basic Diabetes pathophysiology and self care/management . Knowledge Deficits related to medications used for management of diabetes . Difficulty obtaining or cannot afford medications . Does not have glucometer to monitor blood sugar . Film/video editor . Limited Social Support  Case Manager Clinical Goal(s):  Over the next 120 days, patient will demonstrate improved adherence to prescribed treatment plan for diabetes self care/management as evidenced by:  . daily monitoring and recording of CBG  . adherence to ADA/ carb modified diet . exercise 3/4 days/week . adherence to prescribed medication regimen  Interventions:  . Provided education to patient about basic DM disease process . Reviewed medications with patient and discussed importance of medication adherence . Discussed plans with patient for ongoing care management follow up and provided patient with direct contact information for care management team . Provided patient with written educational materials related to hypo and hyperglycemia and importance of correct treatment . Reviewed scheduled/upcoming provider appointments including:  Saw pcp on 12-25-2019.  ask the patient to discuss if he needs to check  his blood sugars on a regular basis.  The patient is not taking medications or checking blood sugars. He cancelled his appointment for 01-26-2020.  . Advised patient, providing education and rationale, to check cbg per direction of the pcp and record, calling pcp for findings outside established parameters.   . Referral made to community resources care guide team for assistance with expressed needs. The patient has talked to care guides and information has been provided. The patient states when he gets his inheritance check he will be relocating to a different area. 01-23-2020: the patient is stressed out about not having his inheritance check yet. He is supposed to get [id number] and he will be able  to move and relocate to a new state and find a place to live. The patient states his brother has filed an extension on the estate and he does not know why because his brother will not talk to him. The patient states his phone will only receive calls he can not make calls. The patients states he has food and a place to live but other than that he can not do anything like get his medications, fast foods, cigarettes or anything else he needs to take care of himself. He has talked to care guides and has resources. Biggest issue is that his ID card does not match his current address.  . Review of patient status, including review of consultants reports, relevant laboratory and other test results, and medications completed.  Patient Self Care Activities:  . UNABLE to independently manage diabetes . Self administers oral medications as prescribed . Checks blood sugars as prescribed and utilize hyper and hypoglycemia protocol as needed . Adheres to prescribed ADA/carb modified  Please see past updates related to this goal by clicking on the "Past Updates" button in the selected goal         Patient verbalizes understanding of instructions provided today.   Telephone follow up appointment with care management team member scheduled for: 03-02-2020 at 1:45 pm  [place], MSN, [facility] Mobile: [phone]

## 2020-01-23 NOTE — Chronic Care Management (AMB) (Signed)
Care Management   Follow Up Note   01/23/2020 Name: Jacob Owen MRN: [id number] DOB: Oct 27, 1965  Referred by: Eulogio Bear, NP Reason for referral : Care Coordination (RNCM Follow up for Chronic Disease Management and Care Coordination Needs )   Jacob Owen is a 54 y.o. year old male who is a primary care patient of Eulogio Bear, NP. The care management team was consulted for assistance with care management and care coordination needs.    Review of patient status, including review of consultants reports, relevant laboratory and other test results, and collaboration with appropriate care team members and the patient's provider was performed as part of comprehensive patient evaluation and provision of chronic care management services.    SDOH (Social Determinants of Health) assessments performed: Yes See Care Plan activities for detailed interventions related to [facility])     Advanced Directives: See Care Plan and Vynca application for related entries.   Goals Addressed              This Visit's Progress   .  RNCM- Pt-"I have not picked up the medication yet" (pt-stated)        CARE PLAN ENTRY (see longtitudinal plan of care for additional care plan information)  Objective:  . Last practice recorded BP readings:  . BP Readings from Last 3 Encounters: .  12/25/19 . (!) 161/88 .  11/24/19 . (!) 164/93 .  11/06/19 . 133/76 .    Marland Kitchen Most recent eGFR/CrCl: No results found for: EGFR  No components found for: CRCL  Current Barriers:  Marland Kitchen Knowledge Deficits related to basic understanding of hypertension pathophysiology and self care management . Knowledge Deficits related to understanding of medications prescribed for management of hypertension . Difficulty obtaining medications . Non-adherence to prescribed medication regimen . Limited Social Support . Film/video editor.   Case Manager Clinical Goal(s):  Marland Kitchen Over the next 90 days, patient will verbalize  understanding of plan for hypertension management . Over the next 90 days, patient will attend all scheduled medical appointments: saw pcp on 12-25-2019. Cancelled appointment for 01-26-2020 due to not having money to pay for appointment.  . Over the next 90 days, patient will demonstrate improved adherence to prescribed treatment plan for hypertension as evidenced by taking all medications as prescribed, monitoring and recording blood pressure as directed, adhering to low sodium/DASH diet . Over the next 90 days, patient will demonstrate improved health management independence as evidenced by checking blood pressure as directed and notifying PCP if SBP>150 or DBP > 90, taking all medications as prescribe, and adhering to a low sodium diet as discussed. . Over the next 90 days, patient will verbalize basic understanding of hypertension disease process and self health management plan as evidenced by normalization of blood pressures  Interventions:  Marland Kitchen UNABLE to independently:manage HTN. The patient does not have a blood pressure cuff. States he constantly has a headache but feels that may be the reason he has a headache. 01-23-2020: The patient states he feels fine and doesn't have headaches as much; however still has not picked up his medication to take.  . Evaluation of current treatment plan related to hypertension self management and patient's adherence to plan as established by provider. 01-23-2020: The patient saw the pcp in the office on 12/25/2019.  He was not being compliant with the recommendations by the provider. Still is not being compliant. The patient states that he can not afford the medications and the medication management clinic can not help  him get it for free unless he changes his address on his ID card and he can not do that because he is going to move once he gets his inheritance money and he would have to do this again, plus he does not have the money to do it right now anyway. He states  that [facility] also would be able to help him with medications but because his registration and ID have his old address they can not unless he gets it changed. The patient states he loaned his roommate and friend money and they have not paid him back and he has no money. He soon will lose the insurance on his car and then his license unless they pay him back very soon.  . Provided education to patient re: stroke prevention, s/s of heart attack and stroke, DASH diet, complications of uncontrolled blood pressure.  The patient is eating a lot of fast foods due to his current living conditions. Education and support. 01-23-2020: the patient states he has food stamps and has plenty of food. Is not eating any fast food because he does not have the money to buy fast food. Denies the need for delivered meals.  . Reviewed medications with patient and discussed importance of compliance.  The patient has not picked up his lisinopirl yet. 01-23-2020: The patient still has not picked up his medications to take. States he has no money to get them at all. Discussed medication management clinic and the patient states they tried to help him but he has a different address on his ID and registration and he would have to pay to have to get it changes and he can not do that.  Education on the importance of taking medications as prescribed. Has information for the medication management team.  . Discussed plans with patient for ongoing care management follow up and provided patient with direct contact information for care management team . Advised patient, providing education and rationale, to monitor blood pressure daily and record, calling PCP for findings outside established parameters.  . Evaluation of smoking cessation. The patient currently said to let the pcp know that he is not smoking right now because he can not even buy cigarettes. The patient states as soon as he has money though he is going to buy cigarettes.  . Reviewed  scheduled/upcoming provider appointments including: The patient cancelled his appointment for 01-26-2020 due to not having the money to pay for his appointment. Will let the pcp know.   Patient Self Care Activities:  . UNABLE to independently manage HTN . Self administers medications as prescribed . Calls provider office for new concerns, questions, or BP outside discussed parameters . Checks BP and records as discussed . Follows a low sodium diet/DASH diet  Please see past updates related to this goal by clicking on the "Past Updates" button in the selected goal       .  RNCM-Pt-"I am not checking my blood sugars" (pt-stated)        CARE PLAN ENTRY (see longtitudinal plan of care for additional care plan information)  Objective:  Lab Results  Component Value Date   HGBA1C 7.7 (H) 10/20/2019 .   Lab Results  Component Value Date   CREATININE 1.02 10/20/2019   CREATININE 1.03 10/06/2019 .   Marland Kitchen No results found for: EGFR  Current Barriers:  Marland Kitchen Knowledge Deficits related to basic Diabetes pathophysiology and self care/management . Knowledge Deficits related to medications used for management of diabetes . Difficulty obtaining  or cannot afford medications . Does not have glucometer to monitor blood sugar . Film/video editor . Limited Social Support  Case Manager Clinical Goal(s):  Over the next 120 days, patient will demonstrate improved adherence to prescribed treatment plan for diabetes self care/management as evidenced by:  . daily monitoring and recording of CBG  . adherence to ADA/ carb modified diet . exercise 3/4 days/week . adherence to prescribed medication regimen  Interventions:  . Provided education to patient about basic DM disease process . Reviewed medications with patient and discussed importance of medication adherence . Discussed plans with patient for ongoing care management follow up and provided patient with direct contact information for care management  team . Provided patient with written educational materials related to hypo and hyperglycemia and importance of correct treatment . Reviewed scheduled/upcoming provider appointments including:  Saw pcp on 12-25-2019.  ask the patient to discuss if he needs to check his blood sugars on a regular basis.  The patient is not taking medications or checking blood sugars. He cancelled his appointment for 01-26-2020.  . Advised patient, providing education and rationale, to check cbg per direction of the pcp and record, calling pcp for findings outside established parameters.   . Referral made to community resources care guide team for assistance with expressed needs. The patient has talked to care guides and information has been provided. The patient states when he gets his inheritance check he will be relocating to a different area. 01-23-2020: the patient is stressed out about not having his inheritance check yet. He is supposed to get 96,000.00 and he will be able to move and relocate to a new state and find a place to live. The patient states his brother has filed an extension on the estate and he does not know why because his brother will not talk to him. The patient states his phone will only receive calls he can not make calls. The patients states he has food and a place to live but other than that he can not do anything like get his medications, fast foods, cigarettes or anything else he needs to take care of himself. He has talked to care guides and has resources. Biggest issue is that his ID card does not match his current address.  . Review of patient status, including review of consultants reports, relevant laboratory and other test results, and medications completed.  Patient Self Care Activities:  . UNABLE to independently manage diabetes . Self administers oral medications as prescribed . Checks blood sugars as prescribed and utilize hyper and hypoglycemia protocol as needed . Adheres to prescribed  ADA/carb modified  Please see past updates related to this goal by clicking on the "Past Updates" button in the selected goal          Telephone follow up appointment with care management team member scheduled for: 03-02-2020 at 1:45 pm  [place], MSN, [facility] Mobile: [phone]

## 2020-01-26 ENCOUNTER — Ambulatory Visit: Payer: Self-pay | Admitting: Nurse Practitioner

## 2020-02-09 ENCOUNTER — Encounter: Payer: Self-pay | Admitting: Nurse Practitioner

## 2020-03-02 ENCOUNTER — Telehealth: Payer: Self-pay | Admitting: General Practice

## 2020-03-02 ENCOUNTER — Telehealth: Payer: Self-pay

## 2020-03-02 NOTE — Telephone Encounter (Signed)
  Chronic Care Management   Outreach Note  03/02/2020 Name: Jacob Owen MRN: [id number] DOB: August 10, 1965  Referred by: Valentino Nose, NP Reason for referral : Chronic Care Management (RNCM Chronic Disease Managment and Care Coordination Needs)   An unsuccessful telephone outreach was attempted today. The patient was referred to the case management team for assistance with care management and care coordination.   Follow Up Plan: A HIPAA compliant phone message was left for the patient providing contact information and requesting a return call.   Alto Denver RN, MSN, CCM Community Care Coordinator Organ  [facility] Mobile: [phone]

## 2020-03-08 ENCOUNTER — Other Ambulatory Visit: Payer: Self-pay

## 2020-03-08 DIAGNOSIS — R0989 Other specified symptoms and signs involving the circulatory and respiratory systems: Secondary | ICD-10-CM

## 2020-03-08 MED ORDER — ALBUTEROL SULFATE HFA 108 (90 BASE) MCG/ACT IN AERS: 2.0000 | INHALATION_SPRAY | Freq: Four times a day (QID) | RESPIRATORY_TRACT | 2 refills | Status: AC | PRN

## 2020-03-08 NOTE — Telephone Encounter (Signed)
Patient last seen 11/24/19 and 12/25/19.

## 2020-04-08 ENCOUNTER — Telehealth: Payer: Self-pay

## 2020-04-08 NOTE — Chronic Care Management (AMB) (Signed)
  Care Management   Note  04/08/2020 Name: Jacob Owen MRN: [id number] DOB: 05-07-1965  [name] is a 54 y.o. year old male who is a primary care patient of Valentino Nose, NP and is actively engaged with the care management team. I reached out to Minette Brine by phone today to assist with re-scheduling a follow up visit with the RN Case Manager  Follow up plan: Patient declines further follow up and engagement by the care management team. Appropriate care team members and provider have been notified via electronic communication.   Pt states that he is moving out of state and will find new PCP there. Penne Lash, RMA Care Guide, Embedded Care Coordination [facility]  [place], [place] [address] Direct Dial: [phone] [email or web address]: [email or web address]

## 2020-04-08 NOTE — Telephone Encounter (Signed)
Pt declines further f/u due to moving out of state

## 2020-04-28 ENCOUNTER — Telehealth: Payer: Self-pay | Admitting: Nurse Practitioner

## 2020-04-28 NOTE — Telephone Encounter (Signed)
Pt has contacted [facility] to [facility] med transferred but needs refills [facility] [place], [place]   Medication Refill - Medication: Metformin and   Lisinopril   Has the patient contacted their pharmacy? No. (Agent: If no, request that the patient contact the pharmacy for the refill.) (Agent: If yes, when and what did the pharmacy advise?)  Preferred Pharmacy (with phone number or street name):  [phone] [place], [place] - [address] AT [facility] [address] Phone:  [phone]  Fax:  [phone]       Agent: Please be advised that RX refills may take up to 3 business days. We ask that you follow-up with your pharmacy.

## 2020-04-28 NOTE — Telephone Encounter (Signed)
Pt has moved and needs all refills for any medications due sent to  [id number], [place] - [address] AT [facility] [address] Phone:  [phone]  Fax:  [phone]     Please advise

## 2020-06-30 ENCOUNTER — Other Ambulatory Visit: Payer: Self-pay

## 2020-06-30 DIAGNOSIS — R0989 Other specified symptoms and signs involving the circulatory and respiratory systems: Secondary | ICD-10-CM

## 2020-06-30 NOTE — Telephone Encounter (Signed)
Patient was last seen 09/26/19 and no upcoming appt.

## 2020-07-15 ENCOUNTER — Telehealth: Payer: Self-pay | Admitting: *Deleted

## 2020-07-15 NOTE — Telephone Encounter (Signed)
Pt is calling to report that he is no longer a patient he has moved to [place].

## 2020-07-15 NOTE — Telephone Encounter (Signed)
Pt called no answer. LVM to call and schedule f/up HTN, DM, HLD.

## 2020-09-10 ENCOUNTER — Telehealth: Payer: Self-pay

## 2020-09-10 NOTE — Telephone Encounter (Signed)
We received a fax from the [employer] [facility] Jacob Owen re-enrollment form. I left a message on patient's voicemail to advise him that I would mail him the forms and he can take them to [facility] dermatology since he is now under their care, and return my call if he had any questions.

## 2023-06-30 DEATH — deceased
